# Patient Record
Sex: Female | Born: 1990 | Hispanic: No | Marital: Married | Smoking: Never smoker
Health system: Southern US, Community
[De-identification: ages and names within clinical notes are randomized; demographics above are authoritative.]

## PROBLEM LIST (undated history)

## (undated) ENCOUNTER — Inpatient Hospital Stay (HOSPITAL_COMMUNITY): Payer: Self-pay

## (undated) DIAGNOSIS — R519 Headache, unspecified: Secondary | ICD-10-CM

## (undated) DIAGNOSIS — Z8619 Personal history of other infectious and parasitic diseases: Secondary | ICD-10-CM

## (undated) DIAGNOSIS — D649 Anemia, unspecified: Secondary | ICD-10-CM

## (undated) DIAGNOSIS — Z8759 Personal history of other complications of pregnancy, childbirth and the puerperium: Secondary | ICD-10-CM

## (undated) DIAGNOSIS — R51 Headache: Secondary | ICD-10-CM

## (undated) HISTORY — DX: Personal history of other complications of pregnancy, childbirth and the puerperium: Z87.59

## (undated) HISTORY — PX: NO PAST SURGERIES: SHX2092

## (undated) HISTORY — DX: Personal history of other infectious and parasitic diseases: Z86.19

## (undated) HISTORY — DX: Anemia, unspecified: D64.9

---
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
## 2015-09-22 NOTE — L&D Delivery Note (Signed)
Delivery Note At 12:05 AM a viable female was delivered via Vaginal, Spontaneous Delivery (Presentation: LOA).  APGAR: 8, 9; weight  .   Placenta status: Spontaneous, intact..  Cord: occult prolapse, around feet.  Cord pH: NA  Anesthesia:  Epidural and local Episiotomy: None Lacerations: 2nd degree;Vaginal; Periclitoral--repaired with red robinson cath in place for urethral localization. Suture Repair: 3.0 vicryl Est. Blood Loss (mL): 400 cc--largely from lacerations.  Mom to postpartum.  Baby to Couplet care / Skin to Skin. Patient plans to take placenta home.  Unsure of contraception.  Nigel Bridgeman 04/19/2016, 12:49 AM

## 2015-10-11 LAB — OB RESULTS CONSOLE HIV ANTIBODY (ROUTINE TESTING): HIV: NONREACTIVE

## 2015-10-11 LAB — OB RESULTS CONSOLE GC/CHLAMYDIA
Chlamydia: NEGATIVE
GC PROBE AMP, GENITAL: NEGATIVE

## 2015-10-11 LAB — OB RESULTS CONSOLE HEPATITIS B SURFACE ANTIGEN: Hepatitis B Surface Ag: NEGATIVE

## 2015-10-11 LAB — OB RESULTS CONSOLE ABO/RH: RH Type: POSITIVE

## 2015-10-11 LAB — OB RESULTS CONSOLE RUBELLA ANTIBODY, IGM: Rubella: IMMUNE

## 2015-10-11 LAB — OB RESULTS CONSOLE ANTIBODY SCREEN: Antibody Screen: NEGATIVE

## 2015-10-11 LAB — OB RESULTS CONSOLE RPR: RPR: NONREACTIVE

## 2015-11-14 ENCOUNTER — Other Ambulatory Visit (HOSPITAL_COMMUNITY): Payer: Self-pay | Admitting: Certified Nurse Midwife

## 2015-11-14 DIAGNOSIS — O365920 Maternal care for other known or suspected poor fetal growth, second trimester, not applicable or unspecified: Secondary | ICD-10-CM

## 2015-11-14 DIAGNOSIS — Z3689 Encounter for other specified antenatal screening: Secondary | ICD-10-CM

## 2015-11-14 DIAGNOSIS — Z3A21 21 weeks gestation of pregnancy: Secondary | ICD-10-CM

## 2015-11-21 ENCOUNTER — Encounter (HOSPITAL_COMMUNITY): Payer: Self-pay

## 2015-11-21 ENCOUNTER — Ambulatory Visit (HOSPITAL_COMMUNITY)
Admission: RE | Admit: 2015-11-21 | Discharge: 2015-11-21 | Disposition: A | Discharge status: Home / Self Care | Payer: BLUE CROSS/BLUE SHIELD | Source: Ambulatory Visit | Attending: Certified Nurse Midwife | Admitting: Certified Nurse Midwife

## 2015-11-21 ENCOUNTER — Ambulatory Visit (HOSPITAL_COMMUNITY): Admission: RE | Admit: 2015-11-21 | Payer: BLUE CROSS/BLUE SHIELD | Source: Ambulatory Visit

## 2015-11-21 ENCOUNTER — Other Ambulatory Visit (HOSPITAL_COMMUNITY): Payer: Self-pay | Admitting: Certified Nurse Midwife

## 2015-11-21 DIAGNOSIS — Z3689 Encounter for other specified antenatal screening: Secondary | ICD-10-CM

## 2015-11-21 DIAGNOSIS — Z36 Encounter for antenatal screening of mother: Secondary | ICD-10-CM | POA: Diagnosis not present

## 2015-11-21 DIAGNOSIS — Z8489 Family history of other specified conditions: Secondary | ICD-10-CM

## 2015-11-21 DIAGNOSIS — Z3A21 21 weeks gestation of pregnancy: Secondary | ICD-10-CM

## 2015-11-21 DIAGNOSIS — O26842 Uterine size-date discrepancy, second trimester: Secondary | ICD-10-CM

## 2015-11-21 DIAGNOSIS — O365920 Maternal care for other known or suspected poor fetal growth, second trimester, not applicable or unspecified: Secondary | ICD-10-CM

## 2015-11-21 HISTORY — DX: Headache: R51

## 2015-11-21 HISTORY — DX: Headache, unspecified: R51.9

## 2015-11-29 ENCOUNTER — Other Ambulatory Visit (HOSPITAL_COMMUNITY): Payer: Self-pay

## 2016-04-09 LAB — OB RESULTS CONSOLE GBS: STREP GROUP B AG: NEGATIVE

## 2016-04-17 ENCOUNTER — Encounter (HOSPITAL_COMMUNITY): Payer: Self-pay | Admitting: *Deleted

## 2016-04-17 ENCOUNTER — Inpatient Hospital Stay (HOSPITAL_COMMUNITY)
Admission: AD | Admit: 2016-04-17 | Discharge: 2016-04-17 | Discharge status: Absent without leave | Payer: BLUE CROSS/BLUE SHIELD | Source: Home / Self Care | Attending: Obstetrics & Gynecology | Admitting: Obstetrics & Gynecology

## 2016-04-17 ENCOUNTER — Telehealth (HOSPITAL_COMMUNITY): Payer: Self-pay | Admitting: *Deleted

## 2016-04-17 ENCOUNTER — Other Ambulatory Visit: Payer: Self-pay | Admitting: Obstetrics & Gynecology

## 2016-04-17 NOTE — Telephone Encounter (Signed)
Preadmission screen  

## 2016-04-18 ENCOUNTER — Inpatient Hospital Stay (HOSPITAL_COMMUNITY): Payer: BLUE CROSS/BLUE SHIELD | Admitting: Anesthesiology

## 2016-04-18 ENCOUNTER — Inpatient Hospital Stay (HOSPITAL_COMMUNITY): Payer: BLUE CROSS/BLUE SHIELD

## 2016-04-18 ENCOUNTER — Encounter (HOSPITAL_COMMUNITY): Payer: Self-pay | Admitting: Emergency Medicine

## 2016-04-18 ENCOUNTER — Inpatient Hospital Stay (HOSPITAL_COMMUNITY)
Admission: RE | Admit: 2016-04-18 | Discharge: 2016-04-20 | DRG: 775 | Disposition: A | Discharge status: Home / Self Care | Payer: BLUE CROSS/BLUE SHIELD | Source: Other Acute Inpatient Hospital | Attending: Obstetrics & Gynecology | Admitting: Obstetrics & Gynecology

## 2016-04-18 ENCOUNTER — Inpatient Hospital Stay (HOSPITAL_COMMUNITY)
Admission: RE | Admit: 2016-04-18 | Discharge: 2016-04-18 | Disposition: A | Discharge status: Home / Self Care | Payer: BLUE CROSS/BLUE SHIELD | Source: Ambulatory Visit | Attending: Obstetrics & Gynecology | Admitting: Obstetrics & Gynecology

## 2016-04-18 DIAGNOSIS — Z6836 Body mass index (BMI) 36.0-36.9, adult: Secondary | ICD-10-CM

## 2016-04-18 DIAGNOSIS — Z8249 Family history of ischemic heart disease and other diseases of the circulatory system: Secondary | ICD-10-CM | POA: Diagnosis not present

## 2016-04-18 DIAGNOSIS — O99013 Anemia complicating pregnancy, third trimester: Secondary | ICD-10-CM | POA: Diagnosis present

## 2016-04-18 DIAGNOSIS — Z3A41 41 weeks gestation of pregnancy: Secondary | ICD-10-CM | POA: Diagnosis not present

## 2016-04-18 DIAGNOSIS — Z823 Family history of stroke: Secondary | ICD-10-CM

## 2016-04-18 DIAGNOSIS — O480 Post-term pregnancy: Secondary | ICD-10-CM | POA: Diagnosis present

## 2016-04-18 DIAGNOSIS — O9081 Anemia of the puerperium: Secondary | ICD-10-CM | POA: Diagnosis not present

## 2016-04-18 DIAGNOSIS — O99214 Obesity complicating childbirth: Secondary | ICD-10-CM | POA: Diagnosis present

## 2016-04-18 DIAGNOSIS — D62 Acute posthemorrhagic anemia: Secondary | ICD-10-CM | POA: Diagnosis not present

## 2016-04-18 LAB — CBC
HCT: 33.4 % — ABNORMAL LOW (ref 36.0–46.0)
Hemoglobin: 10.8 g/dL — ABNORMAL LOW (ref 12.0–15.0)
MCH: 23.3 pg — ABNORMAL LOW (ref 26.0–34.0)
MCHC: 32.3 g/dL (ref 30.0–36.0)
MCV: 72.1 fL — ABNORMAL LOW (ref 78.0–100.0)
PLATELETS: 170 10*3/uL (ref 150–400)
RBC: 4.63 MIL/uL (ref 3.87–5.11)
RDW: 17 % — AB (ref 11.5–15.5)
WBC: 10.7 10*3/uL — AB (ref 4.0–10.5)

## 2016-04-18 LAB — TYPE AND SCREEN
ABO/RH(D): O POS
ANTIBODY SCREEN: NEGATIVE

## 2016-04-18 LAB — ABO/RH: ABO/RH(D): O POS

## 2016-04-18 LAB — RPR: RPR Ser Ql: NONREACTIVE

## 2016-04-18 MED ORDER — FENTANYL CITRATE (PF) 100 MCG/2ML IJ SOLN
50.0000 ug | INTRAMUSCULAR | Status: DC | PRN
  Administered 2016-04-18 (×6): 100 ug via INTRAVENOUS
  Filled 2016-04-18 (×6): qty 2

## 2016-04-18 MED ORDER — OXYTOCIN BOLUS FROM INFUSION
500.0000 mL | Freq: Once | INTRAVENOUS | Status: AC
  Administered 2016-04-19: 500 mL via INTRAVENOUS

## 2016-04-18 MED ORDER — LACTATED RINGERS IV SOLN
INTRAVENOUS | Status: DC
  Administered 2016-04-18 (×3): via INTRAVENOUS

## 2016-04-18 MED ORDER — PHENYLEPHRINE 40 MCG/ML (10ML) SYRINGE FOR IV PUSH (FOR BLOOD PRESSURE SUPPORT)
80.0000 ug | PREFILLED_SYRINGE | INTRAVENOUS | Status: DC | PRN
  Filled 2016-04-18: qty 5
  Filled 2016-04-18: qty 10

## 2016-04-18 MED ORDER — MISOPROSTOL 25 MCG QUARTER TABLET
25.0000 ug | ORAL_TABLET | ORAL | Status: DC | PRN
  Administered 2016-04-18: 25 ug via VAGINAL
  Filled 2016-04-18: qty 0.25
  Filled 2016-04-18: qty 1

## 2016-04-18 MED ORDER — FLEET ENEMA 7-19 GM/118ML RE ENEM: 1.0000 | ENEMA | RECTAL | Status: DC | PRN

## 2016-04-18 MED ORDER — OXYTOCIN 40 UNITS IN LACTATED RINGERS INFUSION - SIMPLE MED
1.0000 m[IU]/min | INTRAVENOUS | Status: DC
  Administered 2016-04-18: 2 m[IU]/min via INTRAVENOUS
  Filled 2016-04-18: qty 1000

## 2016-04-18 MED ORDER — LACTATED RINGERS IV SOLN
500.0000 mL | Freq: Once | INTRAVENOUS | Status: AC
  Administered 2016-04-18: 500 mL via INTRAVENOUS

## 2016-04-18 MED ORDER — LACTATED RINGERS IV SOLN: 500.0000 mL | INTRAVENOUS | Status: DC | PRN

## 2016-04-18 MED ORDER — SODIUM BICARBONATE 8.4 % IV SOLN
INTRAVENOUS | Status: DC | PRN
  Administered 2016-04-18: 5 mL via EPIDURAL

## 2016-04-18 MED ORDER — ONDANSETRON HCL 4 MG/2ML IJ SOLN: 4.0000 mg | Freq: Four times a day (QID) | INTRAMUSCULAR | Status: DC | PRN

## 2016-04-18 MED ORDER — EPHEDRINE 5 MG/ML INJ
10.0000 mg | INTRAVENOUS | Status: DC | PRN
  Filled 2016-04-18: qty 4

## 2016-04-18 MED ORDER — ACETAMINOPHEN 325 MG PO TABS: 650.0000 mg | ORAL_TABLET | ORAL | Status: DC | PRN

## 2016-04-18 MED ORDER — LIDOCAINE HCL (PF) 1 % IJ SOLN
INTRAMUSCULAR | Status: AC
  Administered 2016-04-19: 30 mL
  Filled 2016-04-18: qty 30

## 2016-04-18 MED ORDER — SOD CITRATE-CITRIC ACID 500-334 MG/5ML PO SOLN: 30.0000 mL | ORAL | Status: DC | PRN

## 2016-04-18 MED ORDER — LIDOCAINE HCL (PF) 1 % IJ SOLN
30.0000 mL | INTRAMUSCULAR | Status: AC | PRN
  Administered 2016-04-18 (×3): 4 mL via SUBCUTANEOUS

## 2016-04-18 MED ORDER — PHENYLEPHRINE 40 MCG/ML (10ML) SYRINGE FOR IV PUSH (FOR BLOOD PRESSURE SUPPORT)
80.0000 ug | PREFILLED_SYRINGE | INTRAVENOUS | Status: DC | PRN
  Filled 2016-04-18: qty 10
  Filled 2016-04-18: qty 5
  Filled 2016-04-18: qty 10

## 2016-04-18 MED ORDER — FENTANYL 2.5 MCG/ML BUPIVACAINE 1/10 % EPIDURAL INFUSION (WH - ANES)
14.0000 mL/h | INTRAMUSCULAR | Status: DC | PRN
  Administered 2016-04-18 (×2): 14 mL/h via EPIDURAL
  Filled 2016-04-18 (×2): qty 125

## 2016-04-18 MED ORDER — TERBUTALINE SULFATE 1 MG/ML IJ SOLN
0.2500 mg | Freq: Once | INTRAMUSCULAR | Status: DC | PRN
  Filled 2016-04-18: qty 1

## 2016-04-18 MED ORDER — OXYTOCIN 40 UNITS IN LACTATED RINGERS INFUSION - SIMPLE MED
2.5000 [IU]/h | INTRAVENOUS | Status: DC
  Administered 2016-04-19: 2.5 [IU]/h via INTRAVENOUS

## 2016-04-18 MED ORDER — DIPHENHYDRAMINE HCL 50 MG/ML IJ SOLN: 12.5000 mg | INTRAMUSCULAR | Status: DC | PRN

## 2016-04-18 MED ORDER — OXYTOCIN 40 UNITS IN LACTATED RINGERS INFUSION - SIMPLE MED: 1.0000 m[IU]/min | INTRAVENOUS | Status: DC

## 2016-04-18 NOTE — Progress Notes (Signed)
  Subjective: S/p redose from Anesthesia, more comfortable now.  Objective: BP 114/61   Pulse 76   Temp 98.8 F (37.1 C) (Oral)   Resp 17   Ht 5\' 4"  (1.626 m)   Wt 97.1 kg (214 lb)   LMP 06/26/2015   SpO2 100%   BMI 36.73 kg/m  No intake/output data recorded. No intake/output data recorded.  FHT: Category 1 UC:   regular, every 3 minutes SVE:   Dilation: 7 Effacement (%): 80 Station: -1 Exam by:: Genelle Bal, RN  at 2032 Pitocin at 12 mu/min  Assessment:  Active labor GBS negative  Plan: Continue current care.  Nigel Bridgeman CNM 04/18/2016, 9:41 PM

## 2016-04-18 NOTE — H&P (Signed)
Kathleen Vega is a 25 y.o. female, G1P0 at 35 1/7 weeks, presenting for induction due to post-dates.  Seen at office 7/27, with cervix closed, 30%, vtx, -3.  Is unaware of any UCs.  Patient Active Problem List   Diagnosis Date Noted  . Post-dates pregnancy 04/18/2016  . Anemia affecting pregnancy in third trimester 04/18/2016    History of present pregnancy: Patient entered care at 10 weeks.   EDC of 04/10/16 was established by [place] at 19-20 weeks.   Anatomy scan:  20 weeks, with normal findings and a posterior placenta, but growth lag vs inaccurate dating.  Additional [place] evaluations:   26 3/7 weeks--EFW 32%ile, normal fluid, frank breech, cervix closed.   03/04/16--AFI WNL, vtx. Significant prenatal events:  ? Dating by LMP, with growth lag noted on anatomy US.  F/u US in MFM established due date as 04/10/16, with normal growth. Last evaluation:  04/16/16--Cervix closed, 30%, vtx, -3, weight 214, 1+ edema, BP 144/68, 130/80.  Scheduled for induction 04/18/16.  OB History    Gravida Para Term Preterm AB Living   1             SAB TAB Ectopic Multiple Live Births                 Past Medical History:  Diagnosis Date  . Anemia   . Headache   . Hx of varicella    Past Surgical History:  Procedure Laterality Date  . NO PAST SURGERIES     Family History: Father HTN and CVA; Mother lupus Social History:  reports that she has never smoked. She has never used smokeless tobacco. She reports that she does not drink alcohol or use drugs.  Patient is Tree surgeon, married to Teofilo Pod, who is involved and supportive.  She is employed.   Prenatal Transfer Tool  Maternal Diabetes: No Genetic Screening: Declined Maternal Ultrasounds/Referrals: Normal Fetal Ultrasounds or other Referrals:  None Maternal Substance Abuse:  No Significant Maternal Medications:  None Significant Maternal Lab Results: Lab values include: Group B Strep negative  TDAP 01/07/16 Flu 01/07/16  ROS:  Good  fetal movement, denies UCs  No Known Allergies   Dilation: Closed Effacement (%): Thick Station: -3 Exam by:: V.Saprina Chuong CNM Blood pressure 124/71, pulse 72, temperature 98.5 F (36.9 C), temperature source Oral, resp. rate 18, height  (1.626 m), weight 97.1 kg (214 lb), last menstrual period 06/26/2015.  Chest clear Heart RRR without murmur Abd gravid, NT, FH 40 cm, EFW 8-9 lbs Pelvic: Posterior, closed, soft, thick, vtx, -3 Ext: WNL Cytotech 25 mcg placed in posterior fornix at 0117.  FHR: Category 1 UCs:  Occasional, mild, patient unaware  Prenatal labs: ABO, Rh: O/Positive/-- (01/20 0000) Antibody: Negative (01/20 0000) Rubella:  Immune RPR: Nonreactive (01/20 0000)  HBsAg: Negative (01/20 0000)  HIV: Non-reactive (01/20 0000)  GBS: Negative (07/20 0000) Sickle cell/Hgb electrophoresis:  AA Pap:  WNL 09/2015 GC:  Negative 03/19/16 Chlamydia:  Negative 03/19/16 Genetic screenings:  Declined Glucola:  WNL Other:   Hgb 11.4 at NOB, 9.3 at 28 weeks   Assessment/Plan: IUP at 41 1/7 weeks--for induction GBS negative Unfavorable cervix  Plan: Admit to Birthing Suite per consult with Dr Sallye Ober Routine [facility] orders Pain med/epidural prn Cytotech for cervical ripening, then pitocin/foley bulb/AROM as appropriate Risks and benefits of induction were reviewed, including failure of method, prolonged labor, need for further intervention, risk of cesarean.  Patient and family seem to understand these risks and wish to  proceed.  Emali Heyward CNM, [place] 04/18/2016, 1:22 AM

## 2016-04-18 NOTE — Anesthesia Procedure Notes (Signed)
Epidural Patient location during procedure: OB Start time: 04/18/2016 5:33 PM  Staffing Anesthesiologist: Finesse Fielder  Preanesthetic Checklist Completed: patient identified, site marked, surgical consent, pre-op evaluation, timeout performed, IV checked, risks and benefits discussed and monitors and equipment checked  Epidural Patient position: sitting Prep: site prepped and draped and DuraPrep Patient monitoring: continuous pulse ox and blood pressure Approach: midline Location: L3-L4 Injection technique: LOR air  Needle:  Needle type: Tuohy  Needle gauge: 17 G Needle length: 9 cm and 9 Needle insertion depth: 6 cm Catheter type: closed end flexible Catheter size: 19 Gauge Catheter at skin depth: 10 cm Test dose: negative  Assessment Events: blood not aspirated, injection not painful, no injection resistance, negative IV test and no paresthesia

## 2016-04-18 NOTE — Progress Notes (Addendum)
Patient ID: Kathleen Vega, female   DOB: 02/01/1991, 25 y.o.   MRN: [id number] Pt feeling some contractions BP (!) 103/48   Pulse 65   Temp 97.7 F (36.5 C) (Axillary)   Resp 18   Ht 5\' 4"  (1.626 m)   Wt 214 lb (97.1 kg)   LMP 06/26/2015   BMI 36.73 kg/m  Cat 1 Continue pitocin 3/100/-3 AROM clear Anticipate SVD

## 2016-04-18 NOTE — Progress Notes (Signed)
  Subjective: Feeling more pressure, pain again on right side.  Objective: BP (!) 116/57   Pulse 72   Temp 98.8 F (37.1 C) (Oral)   Resp 18   Ht 5\' 4"  (1.626 m)   Wt 97.1 kg (214 lb)   LMP 06/26/2015   SpO2 100%   BMI 36.73 kg/m  No intake/output data recorded. No intake/output data recorded.  FHT: Category 1 UC:   regular, every 3 minutes SVE:   Dilation: 10 Effacement (%): 80 Station: -1 Exam by:: Renaldo Reel. CNM  Pitocin at 14 mu/min  Assessment:  2nd stage labor  Plan: Observe at present, laboring down, start pushing with increased pressure.  Nigel Bridgeman CNM 04/18/2016, 10:39 PM

## 2016-04-18 NOTE — Anesthesia Preprocedure Evaluation (Signed)
Anesthesia Evaluation  Patient identified by MRN, date of birth, ID band Patient awake    Reviewed: Allergy & Precautions, NPO status , Patient's Chart, lab work & pertinent test results  Airway Mallampati: I  TM Distance: >3 FB Neck ROM: Full    Dental  (+) Teeth Intact, Dental Advisory Given   Pulmonary    breath sounds clear to auscultation       Cardiovascular  Rhythm:Regular Rate:Normal     Neuro/Psych    GI/Hepatic   Endo/Other  Morbid obesity  Renal/GU      Musculoskeletal   Abdominal   Peds  Hematology   Anesthesia Other Findings   Reproductive/Obstetrics (+) Pregnancy                             Anesthesia Physical Anesthesia Plan  ASA: II  Anesthesia Plan: Epidural   Post-op Pain Management:    Induction:   Airway Management Planned:   Additional Equipment:   Intra-op Plan:   Post-operative Plan:   Informed Consent: I have reviewed the patients History and Physical, chart, labs and discussed the procedure including the risks, benefits and alternatives for the proposed anesthesia with the patient or authorized representative who has indicated his/her understanding and acceptance.   Dental advisory given  Plan Discussed with: Anesthesiologist  Anesthesia Plan Comments:         Anesthesia Quick Evaluation

## 2016-04-18 NOTE — Progress Notes (Addendum)
  Subjective: Has cramped significantly since Cytotech--received Fentanyl at 0423 with benefit.  Objective: BP 116/69 (BP Location: Left Arm)   Pulse 68   Temp 98.3 F (36.8 C) (Oral)   Resp 18   Ht 5\' 4"  (1.626 m)   Wt 97.1 kg (214 lb)   LMP 06/26/2015   BMI 36.73 kg/m  No intake/output data recorded. No intake/output data recorded.  FHT: Category 1 UC:   irregular, every 2-5 minutes SVE:   Dilation: 1 Effacement (%): 90 Station: -2 Exam by:: V.Breyona Swander CNM  Very adequate pelvis BBOW  Assessment:  Induction for post-dates Good response to single dose cytotech at 0117 GBS negative  Plan: Start pitocin per low dose protocol. Pain med prn.  Nigel Bridgeman CNM 04/18/2016, 6:26 AM

## 2016-04-18 NOTE — Progress Notes (Signed)
Patient reports inability to rest and remain in one position due to continuous cramping causing constant movement of fetal monitors prompting RN repositioning of monitors. CNM aware of spacing in FHR tracing is due to maternal movement.

## 2016-04-18 NOTE — Anesthesia Pain Management Evaluation Note (Signed)
  CRNA Pain Management Visit Note  Patient: Kathleen Vega, 25 y.o., female  "Hello I am a member of the anesthesia team at [facility]. We have an anesthesia team available at all times to provide care throughout the hospital, including epidural management and anesthesia for C-section. I don't know your plan for the delivery whether it a natural birth, water birth, IV sedation, nitrous supplementation, doula or epidural, but we want to meet your pain goals."   1.Was your pain managed to your expectations on prior hospitalizations?    2.What is your expectation for pain management during this hospitalization?      3.How can we help you reach that goal?   Record the patient's initial score and the patient's pain goal.   Pain: 5  Pain Goal: 9 [facility] wants you to be able to say your pain was always managed very well.  Laban Emperor 04/18/2016

## 2016-04-19 ENCOUNTER — Encounter (HOSPITAL_COMMUNITY): Payer: Self-pay

## 2016-04-19 LAB — CBC
HEMATOCRIT: 27.5 % — AB (ref 36.0–46.0)
Hemoglobin: 9 g/dL — ABNORMAL LOW (ref 12.0–15.0)
MCH: 23.5 pg — ABNORMAL LOW (ref 26.0–34.0)
MCHC: 32.7 g/dL (ref 30.0–36.0)
MCV: 71.8 fL — ABNORMAL LOW (ref 78.0–100.0)
Platelets: 161 10*3/uL (ref 150–400)
RBC: 3.83 MIL/uL — ABNORMAL LOW (ref 3.87–5.11)
RDW: 17 % — AB (ref 11.5–15.5)
WBC: 20.6 10*3/uL — ABNORMAL HIGH (ref 4.0–10.5)

## 2016-04-19 MED ORDER — BENZOCAINE-MENTHOL 20-0.5 % EX AERO
1.0000 "application " | INHALATION_SPRAY | CUTANEOUS | Status: DC | PRN
  Administered 2016-04-19: 1 via TOPICAL
  Filled 2016-04-19: qty 56

## 2016-04-19 MED ORDER — SENNOSIDES-DOCUSATE SODIUM 8.6-50 MG PO TABS
2.0000 | ORAL_TABLET | ORAL | Status: DC
  Administered 2016-04-19: 2 via ORAL
  Filled 2016-04-19: qty 2

## 2016-04-19 MED ORDER — ZOLPIDEM TARTRATE 5 MG PO TABS: 5.0000 mg | ORAL_TABLET | Freq: Every evening | ORAL | Status: DC | PRN

## 2016-04-19 MED ORDER — DIPHENHYDRAMINE HCL 25 MG PO CAPS: 25.0000 mg | ORAL_CAPSULE | Freq: Four times a day (QID) | ORAL | Status: DC | PRN

## 2016-04-19 MED ORDER — SIMETHICONE 80 MG PO CHEW: 80.0000 mg | CHEWABLE_TABLET | ORAL | Status: DC | PRN

## 2016-04-19 MED ORDER — COCONUT OIL OIL: 1.0000 "application " | TOPICAL_OIL | Status: DC | PRN

## 2016-04-19 MED ORDER — DIBUCAINE 1 % RE OINT: 1.0000 "application " | TOPICAL_OINTMENT | RECTAL | Status: DC | PRN

## 2016-04-19 MED ORDER — WITCH HAZEL-GLYCERIN EX PADS: 1.0000 "application " | MEDICATED_PAD | CUTANEOUS | Status: DC | PRN

## 2016-04-19 MED ORDER — OXYCODONE HCL 5 MG PO TABS: 10.0000 mg | ORAL_TABLET | ORAL | Status: DC | PRN

## 2016-04-19 MED ORDER — OXYCODONE HCL 5 MG PO TABS: 5.0000 mg | ORAL_TABLET | ORAL | Status: DC | PRN

## 2016-04-19 MED ORDER — ONDANSETRON HCL 4 MG PO TABS: 4.0000 mg | ORAL_TABLET | ORAL | Status: DC | PRN

## 2016-04-19 MED ORDER — ACETAMINOPHEN 325 MG PO TABS: 650.0000 mg | ORAL_TABLET | ORAL | Status: DC | PRN

## 2016-04-19 MED ORDER — ONDANSETRON HCL 4 MG/2ML IJ SOLN: 4.0000 mg | INTRAMUSCULAR | Status: DC | PRN

## 2016-04-19 MED ORDER — TETANUS-DIPHTH-ACELL PERTUSSIS 5-2.5-18.5 LF-MCG/0.5 IM SUSP: 0.5000 mL | Freq: Once | INTRAMUSCULAR | Status: DC

## 2016-04-19 MED ORDER — FERROUS SULFATE 300 (60 FE) MG/5ML PO SYRP
300.0000 mg | ORAL_SOLUTION | Freq: Every day | ORAL | Status: DC
  Administered 2016-04-19 – 2016-04-20 (×2): 300 mg via ORAL
  Filled 2016-04-19 (×2): qty 5

## 2016-04-19 MED ORDER — IBUPROFEN 600 MG PO TABS
600.0000 mg | ORAL_TABLET | Freq: Four times a day (QID) | ORAL | Status: DC
  Administered 2016-04-19 – 2016-04-20 (×5): 600 mg via ORAL
  Filled 2016-04-19 (×5): qty 1

## 2016-04-19 MED ORDER — PRENATAL MULTIVITAMIN CH
1.0000 | ORAL_TABLET | Freq: Every day | ORAL | Status: DC
  Administered 2016-04-19: 1 via ORAL
  Filled 2016-04-19: qty 1

## 2016-04-19 NOTE — Addendum Note (Signed)
Addendum  created 04/19/16 0755 by Angela Adam, CRNA   Charge Capture section accepted, Sign clinical note, Visit diagnoses modified

## 2016-04-19 NOTE — Progress Notes (Addendum)
Subjective: Postpartum Day 0: Vaginal delivery, peri-clitoral and 2nd degree vaginal laceration Patient up ad lib, reports no syncope or dizziness. Feeding:  Breast feeding, plans pumping Contraceptive plan:  Undecided at present  Objective: Vital signs in last 24 hours: Temp:  [97.5 F (36.4 C)-99 F (37.2 C)] 98.7 F (37.1 C) (07/30 1720) Pulse Rate:  [64-128] 74 (07/30 1720) Resp:  [17-20] 18 (07/30 1720) BP: (102-132)/(49-73) 117/59 (07/30 1720) SpO2:  [97 %-100 %] 100 % (07/30 1720)  Physical Exam:  General: alert Lochia: appropriate Uterine Fundus: firm Perineum: healing well DVT Evaluation: No evidence of DVT seen on physical exam. Negative Homan's sign.   CBC Latest Ref Rng & Units 04/19/2016 04/18/2016  WBC 4.0 - 10.5 K/uL 20.6(H) 10.7(H)  Hemoglobin 12.0 - 15.0 g/dL 9.0(L) 10.8(L)  Hematocrit 36.0 - 46.0 % 27.5(L) 33.4(L)  Platelets 150 - 400 K/uL 161 170     Assessment/Plan: Status post vaginal delivery day 0. Exacerbation of chronic anemia due to acute blood loss, hemodynamically stable Leukocytosis without fever Stable Continue current care. Repeat CBC/diff tomorrow. Continue Fe suspension daily      Marleta Lapierre, VICKICNM 04/19/2016, 8:31 PM

## 2016-04-19 NOTE — Anesthesia Postprocedure Evaluation (Signed)
Anesthesia Post Note  Patient: Kathleen Vega  Procedure(s) Performed: * No procedures listed *  Patient location during evaluation: Mother Baby Anesthesia Type: Epidural Level of consciousness: awake and alert, oriented and patient cooperative Pain management: pain level controlled Vital Signs Assessment: post-procedure vital signs reviewed and stable Respiratory status: spontaneous breathing Cardiovascular status: stable Postop Assessment: no headache, epidural receding, patient able to bend at knees and no signs of nausea or vomiting Anesthetic complications: no Comments: Denies pain.     Last Vitals:  Vitals:   04/19/16 0315 04/19/16 0554  BP: (!) 114/58 (!) 112/59  Pulse: 76 85  Resp: 18 18  Temp: 36.4 C 36.8 C    Last Pain:  Vitals:   04/19/16 0554  TempSrc:   PainSc: (P) 0-No pain   Pain Goal: Patients Stated Pain Goal: 4 (04/18/16 1741)               Merrilyn Puma

## 2016-04-19 NOTE — Anesthesia Postprocedure Evaluation (Signed)
Anesthesia Post Note  Patient: Kathleen Vega  Procedure(s) Performed: * No procedures listed *  Patient location during evaluation: Mother Baby Anesthesia Type: Epidural Level of consciousness: awake and alert Pain management: pain level controlled Vital Signs Assessment: post-procedure vital signs reviewed and stable Respiratory status: spontaneous breathing, nonlabored ventilation and respiratory function stable Cardiovascular status: stable Postop Assessment: no headache, no backache and epidural receding Anesthetic complications: no     Last Vitals:  Vitals:   04/19/16 0315 04/19/16 0554  BP: (!) 114/58 (!) 112/59  Pulse: 76 85  Resp: 18 18  Temp: 36.4 C 36.8 C    Last Pain:  Vitals:   04/19/16 0554  TempSrc:   PainSc: (P) 0-No pain   Pain Goal: Patients Stated Pain Goal: 4 (04/18/16 1741)               Chana Lindstrom A

## 2016-04-19 NOTE — Lactation Note (Signed)
This note was copied from a baby's chart. Lactation Consultation Note  Patient Name: Kathleen Vega [id number] Date: 04/19/2016 Reason for consult: Initial assessment Baby at 17 hr of life. Mom is latching baby while in the hospital but her long term goal is pump to feed. She requested help setting up her personal Medela DEBP. There are 2 grandmothers present that are not supportive of bf. They are telling mom the baby is starving, there is no milk in her breast yet, and the baby needs some formula. Baby has a noticeable lingual frenulum with an anterior insertion point. Baby can cup tongue, has some lateralization, has good peristolic motion in between periods of biting down, can lift tongue to midline, and can extend tongue slightly over gum ridge. Mom reports no pain with latch but had a compression stripe. Mom is letting baby suck the tip of the nipple in and is not supporting baby while she is latching. Mom prefers the football position. Showed mom how to wait for a wide open mouth then guide baby deeply onto the breast. Discussed baby behavior, feeding frequency, pumping, baby belly size, voids, wt loss, breast changes, and nipple care. Mom can manually express and has spoon in room. Given lactation handouts. Aware of OP services and support group.      Maternal Data Has patient been taught Hand Expression?: Yes Does the patient have breastfeeding experience prior to this delivery?: No  Feeding Feeding Type: Breast Fed  LATCH Score/Interventions Latch: Repeated attempts needed to sustain latch, nipple held in mouth throughout feeding, stimulation needed to elicit sucking reflex. Intervention(s): Adjust position;Assist with latch;Breast compression  Audible Swallowing: A few with stimulation Intervention(s): Hand expression;Skin to skin;Alternate breast massage  Type of Nipple: Everted at rest and after stimulation  Comfort (Breast/Nipple): Soft / non-tender     Hold (Positioning):  Full assist, staff holds infant at breast Intervention(s): Support Pillows;Position options;Skin to skin  LATCH Score: 6  Lactation Tools Discussed/Used WIC Program: No Pump Review: Setup, frequency, and cleaning;Milk Storage Initiated by:: ES Date initiated:: 04/19/16   Consult Status Consult Status: Follow-up Date: 04/20/16 Follow-up type: In-patient    Rulon Eisenmenger 04/19/2016, 5:30 PM

## 2016-04-20 LAB — CBC WITH DIFFERENTIAL/PLATELET
BASOS ABS: 0 10*3/uL (ref 0.0–0.1)
Basophils Relative: 0 %
EOS ABS: 0.2 10*3/uL (ref 0.0–0.7)
EOS PCT: 1 %
HCT: 28.3 % — ABNORMAL LOW (ref 36.0–46.0)
HEMOGLOBIN: 9 g/dL — AB (ref 12.0–15.0)
Lymphocytes Relative: 22 %
Lymphs Abs: 3 10*3/uL (ref 0.7–4.0)
MCH: 23.1 pg — ABNORMAL LOW (ref 26.0–34.0)
MCHC: 31.8 g/dL (ref 30.0–36.0)
MCV: 72.6 fL — ABNORMAL LOW (ref 78.0–100.0)
Monocytes Absolute: 0.8 10*3/uL (ref 0.1–1.0)
Monocytes Relative: 6 %
NEUTROS PCT: 71 %
Neutro Abs: 9.7 10*3/uL — ABNORMAL HIGH (ref 1.7–7.7)
PLATELETS: 162 10*3/uL (ref 150–400)
RBC: 3.9 MIL/uL (ref 3.87–5.11)
RDW: 17.3 % — ABNORMAL HIGH (ref 11.5–15.5)
WBC: 13.8 10*3/uL — AB (ref 4.0–10.5)

## 2016-04-20 MED ORDER — IBUPROFEN 600 MG PO TABS: 600.0000 mg | ORAL_TABLET | Freq: Four times a day (QID) | ORAL | 0 refills | Status: DC | PRN

## 2016-04-20 MED ORDER — OXYCODONE-ACETAMINOPHEN 5-325 MG PO TABS: 1.0000 | ORAL_TABLET | Freq: Four times a day (QID) | ORAL | 0 refills | Status: DC | PRN

## 2016-04-20 NOTE — Lactation Note (Signed)
This note was copied from a baby's chart. Lactation Consultation Note RN called for assistance latching. Mom states she can latch baby to Rt. Breast but not the Lt. Mom has pendulum breast that look the same. Everted nipple at the bottom end of the breast. Compressible w/good flow of colostrum. Elevated breast w/wash cloth for support. In football position latched baby to breast. LC feels its a positional w/mom and coordination issue. Baby latched well, heard occasional swallow. Mom is quiet and timid. Encouraged to do teach back to make sure mom understands. Mom appears to be unsure of her self or have slow cognition? Or tired?? Mom had told Rn she wanted to go home today. THIS LC DOES NOT RECOMMEND MOM TO BE D/C HOME TODAY. LC feels mom needs to be more independent and more sure w/BF.  Patient Name: Kathleen Vega [id number] Date: 04/20/2016 Reason for consult: Follow-up assessment;Difficult latch   Maternal Data    Feeding Feeding Type: Breast Fed Length of feed: 15 min (still BF)  LATCH Score/Interventions Latch: Repeated attempts needed to sustain latch, nipple held in mouth throughout feeding, stimulation needed to elicit sucking reflex. Intervention(s): Adjust position;Assist with latch;Breast massage;Breast compression  Audible Swallowing: A few with stimulation Intervention(s): Skin to skin;Hand expression;Alternate breast massage  Type of Nipple: Everted at rest and after stimulation  Comfort (Breast/Nipple): Soft / non-tender     Hold (Positioning): Assistance needed to correctly position infant at breast and maintain latch. Intervention(s): Breastfeeding basics reviewed;Support Pillows;Position options;Skin to skin  LATCH Score: 7  Lactation Tools Discussed/Used     Consult Status Consult Status: Follow-up Date: 04/20/16 (in pm) Follow-up type: In-patient    Rishon Thilges, Diamond Nickel 04/20/2016, 3:21 AM

## 2016-04-20 NOTE — Discharge Summary (Signed)
Obstetric Discharge Summary Reason for Admission: induction of labor Prenatal Procedures: ultrasound Intrapartum Procedures: spontaneous vaginal delivery Postpartum Procedures: none Complications-Operative and Postpartum: none Hemoglobin  Date Value Ref Range Status  04/20/2016 9.0 (L) 12.0 - 15.0 g/dL Final   HCT  Date Value Ref Range Status  04/20/2016 28.3 (L) 36.0 - 46.0 % Final    Physical Exam:  General: alert and no distress Lochia: appropriate Uterine Fundus: firm Incision: n/a DVT Evaluation: No evidence of DVT seen on physical exam.  Discharge Diagnoses: Term Pregnancy-delivered  Discharge Information: Date: 04/20/2016 Activity: pelvic rest Diet: routine Medications: PNV, Ibuprofen and Percocet Condition: stable Instructions: refer to practice specific booklet Discharge to: home Follow-up Information    [facility] Follow up in 6 week(s).   Specialty:  Obstetrics and Gynecology Why:  Please call to schedule post partum appointment Contact information: [address] [place] [address] [phone]          Newborn Data: Live born female  Birth Weight: 8 lb 2.9 oz (3711 g) APGAR: 8, 9  Home with mother.  Mardella Nuckles Y 04/20/2016, 11:02 AM

## 2016-04-20 NOTE — Lactation Note (Signed)
This note was copied from a baby's chart. Lactation Consultation Note; Mom ready for DC- baby in car seat. Mom states she is going to pump and bottle feed EBM to baby. Does not want to put baby to the breast. Has her own Medela pump for home. Reports LC assisted her with setup of pump but she has not tried it yet. Dad already has it in the car. Encouraged mom to pump at least 8 times/day to promote milk supply.Had to repeat instructions several times for mom to understand. Reviewed OP appointments as resource if she wants to put baby to breast. No questions at present. To call prn  Patient Name: Kathleen Vega [id number] Date: 04/20/2016 Reason for consult: Follow-up assessment   Maternal Data Formula Feeding for Exclusion: No Has patient been taught Hand Expression?: Yes Does the patient have breastfeeding experience prior to this delivery?: No  Feeding Feeding Type: Breast Fed Length of feed: 15 min  LATCH Score/Interventions Latch: Grasps breast easily, tongue down, lips flanged, rhythmical sucking. Intervention(s): Adjust position  Audible Swallowing: A few with stimulation Intervention(s): Skin to skin  Type of Nipple: Everted at rest and after stimulation  Comfort (Breast/Nipple): Soft / non-tender     Hold (Positioning): Assistance needed to correctly position infant at breast and maintain latch.  LATCH Score: 8  Lactation Tools Discussed/Used WIC Program: No   Consult Status Consult Status: Complete    Pamelia Hoit 04/20/2016, 10:11 AM

## 2016-04-24 ENCOUNTER — Inpatient Hospital Stay (HOSPITAL_COMMUNITY): Admission: RE | Admit: 2016-04-24 | Payer: BLUE CROSS/BLUE SHIELD | Source: Ambulatory Visit

## 2017-08-01 ENCOUNTER — Inpatient Hospital Stay (HOSPITAL_COMMUNITY): Payer: 59

## 2017-08-01 ENCOUNTER — Inpatient Hospital Stay (HOSPITAL_COMMUNITY)
Admission: AD | Admit: 2017-08-01 | Discharge: 2017-08-01 | Disposition: A | Discharge status: Home / Self Care | Payer: 59 | Source: Ambulatory Visit | Attending: Obstetrics and Gynecology | Admitting: Obstetrics and Gynecology

## 2017-08-01 ENCOUNTER — Other Ambulatory Visit: Payer: Self-pay

## 2017-08-01 ENCOUNTER — Encounter (HOSPITAL_COMMUNITY): Payer: Self-pay | Admitting: *Deleted

## 2017-08-01 DIAGNOSIS — R109 Unspecified abdominal pain: Secondary | ICD-10-CM | POA: Diagnosis present

## 2017-08-01 DIAGNOSIS — Z3A01 Less than 8 weeks gestation of pregnancy: Secondary | ICD-10-CM | POA: Insufficient documentation

## 2017-08-01 DIAGNOSIS — O26891 Other specified pregnancy related conditions, first trimester: Secondary | ICD-10-CM | POA: Diagnosis not present

## 2017-08-01 LAB — URINALYSIS, ROUTINE W REFLEX MICROSCOPIC
Bilirubin Urine: NEGATIVE
GLUCOSE, UA: NEGATIVE mg/dL
Hgb urine dipstick: NEGATIVE
Ketones, ur: NEGATIVE mg/dL
Nitrite: NEGATIVE
PH: 7 (ref 5.0–8.0)
Protein, ur: NEGATIVE mg/dL
SPECIFIC GRAVITY, URINE: 1.012 (ref 1.005–1.030)

## 2017-08-01 LAB — WET PREP, GENITAL
SPERM: NONE SEEN
TRICH WET PREP: NONE SEEN
YEAST WET PREP: NONE SEEN

## 2017-08-01 LAB — POCT PREGNANCY, URINE: Preg Test, Ur: POSITIVE — AB

## 2017-08-01 LAB — HCG, QUANTITATIVE, PREGNANCY: HCG, BETA CHAIN, QUANT, S: 29390 m[IU]/mL — AB (ref <5)

## 2017-08-01 MED ORDER — PROMETHAZINE HCL 12.5 MG PO TABS: 12.5000 mg | ORAL_TABLET | Freq: Four times a day (QID) | ORAL | 0 refills | Status: DC | PRN

## 2017-08-01 MED ORDER — METRONIDAZOLE 500 MG PO TABS: 500.0000 mg | ORAL_TABLET | Freq: Two times a day (BID) | ORAL | 0 refills | Status: DC

## 2017-08-01 NOTE — MAU Note (Signed)
Having bad cramping in mid to lower abd since 0030. Denies LOF or bleeding

## 2017-08-01 NOTE — MAU Provider Note (Signed)
History    Towanda Malkiniffany Olthoff is a 26y.o. G2P1001 at 6.2wks who presents unannounced, for abdominal cramping.  Reports pain started at 0030 and has been constant in nature.  Patient denies current back pain, but states pain was radiating to her back.  Patient denies VB and discharge, constipation, diarrhea, or problems with urination.  Most recent sexual encounter was on Thursday.  LMP: 06/18/2017  Patient Active Problem List   Diagnosis Date Noted  . Vaginal delivery 04/19/2016  . Post-dates pregnancy 04/18/2016  . Anemia affecting pregnancy in third trimester 04/18/2016    Chief Complaint  Patient presents with  . Abdominal Pain   HPI  OB History    Gravida Para Term Preterm AB Living   2 1 1     1    SAB TAB Ectopic Multiple Live Births         0 1      Past Medical History:  Diagnosis Date  . Anemia   . Headache   . Hx of varicella     Past Surgical History:  Procedure Laterality Date  . NO PAST SURGERIES      Family History  Problem Relation Age of Onset  . Alcohol abuse Neg Hx   . Arthritis Neg Hx   . Asthma Neg Hx   . Birth defects Neg Hx   . Cancer Neg Hx   . COPD Neg Hx   . Depression Neg Hx   . Diabetes Neg Hx   . Drug abuse Neg Hx   . Early death Neg Hx   . Hearing loss Neg Hx   . Heart disease Neg Hx   . Hyperlipidemia Neg Hx   . Hypertension Neg Hx   . Kidney disease Neg Hx   . Learning disabilities Neg Hx   . Mental illness Neg Hx   . Mental retardation Neg Hx   . Miscarriages / Stillbirths Neg Hx   . Stroke Neg Hx   . Vision loss Neg Hx   . Varicose Veins Neg Hx     Social History   Tobacco Use  . Smoking status: Never Smoker  . Smokeless tobacco: Never Used  Substance Use Topics  . Alcohol use: No  . Drug use: No    Allergies: No Known Allergies  Medications Prior to Admission  Medication Sig Dispense Refill Last Dose  . Prenatal Vit-Fe Fumarate-FA (PRENATAL MULTIVITAMIN) TABS tablet Take 1 tablet by mouth daily.   07/31/2017 at  Unknown time  . ferrous sulfate 300 (60 Fe) MG/5ML syrup Take 300 mg by mouth daily.   04/17/2016 at Unknown time  . ibuprofen (ADVIL,MOTRIN) 600 MG tablet Take 1 tablet (600 mg total) by mouth every 6 (six) hours as needed. 30 tablet 0   . oxyCODONE-acetaminophen (PERCOCET) 5-325 MG tablet Take 1-2 tablets by mouth every 6 (six) hours as needed for moderate pain or severe pain. 30 tablet 0     ROS  See HPI Above Physical Exam   Blood pressure 120/64, pulse 69, temperature (!) 97.2 F (36.2 C), resp. rate 18, height 5\' 4"  (1.626 m), weight 91.6 kg (202 lb), last menstrual period 06/18/2017, unknown if currently breastfeeding.  Results for orders placed or performed during the hospital encounter of 08/01/17 (from the past 24 hour(s))  Urinalysis, Routine w reflex microscopic     Status: Abnormal   Collection Time: 08/01/17  1:20 AM  Result Value Ref Range   Color, Urine YELLOW YELLOW   APPearance CLOUDY (A) CLEAR  Specific Gravity, Urine 1.012 1.005 - 1.030   pH 7.0 5.0 - 8.0   Glucose, UA NEGATIVE NEGATIVE mg/dL   Hgb urine dipstick NEGATIVE NEGATIVE   Bilirubin Urine NEGATIVE NEGATIVE   Ketones, ur NEGATIVE NEGATIVE mg/dL   Protein, ur NEGATIVE NEGATIVE mg/dL   Nitrite NEGATIVE NEGATIVE   Leukocytes, UA TRACE (A) NEGATIVE   RBC / HPF 0-5 0 - 5 RBC/hpf   WBC, UA 0-5 0 - 5 WBC/hpf   Bacteria, UA RARE (A) NONE SEEN   Squamous Epithelial / LPF 0-5 (A) NONE SEEN   Mucus PRESENT   Pregnancy, urine POC     Status: Abnormal   Collection Time: 08/01/17  1:30 AM  Result Value Ref Range   Preg Test, Ur POSITIVE (A) NEGATIVE    Physical Exam  Constitutional: She is oriented to person, place, and time. She appears well-developed and well-nourished. No distress.  HENT:  Head: Normocephalic and atraumatic.  Eyes: Conjunctivae are normal.  Neck: Normal range of motion.  Cardiovascular: Normal rate and regular rhythm.  Respiratory: Effort normal and breath sounds normal.  GI: Soft.  Bowel sounds are normal.  Genitourinary: Uterus is enlarged. Cervix exhibits discharge. Cervix exhibits no motion tenderness and no friability. No bleeding in the vagina. Vaginal discharge found.  Genitourinary Comments:  Speculum Exam: -Vaginal Vault: Small amt thin white malodorous discharge -wet prep collected -Cervix:Thick mucoid discharge from os-GC/CT collected -Bimanual Exam: Closed/Long/Thick/Ballotable  Musculoskeletal: Normal range of motion.  Neurological: She is alert and oriented to person, place, and time.  Skin: Skin is warm and dry.  Psychiatric: She has a normal mood and affect. Her behavior is normal.     ED Course  Assessment: IUP at 6.2wks by LMP Abdominal Pain  Plan: -PE as above -Labs: UA, Wet prep, Gc/CT, Quant -US to confirm pregnancy  Follow Up (0340) -US report returns with FHR at 97bpm -Discussed need for f/u ultrasound for low FHR on 11/19 during NOB interview -Clue cells noted; Rx for flagyl 500mg  BID Disp 14, RF 0 -Discussed ways to prevent bacterial vaginosis:  +Wear cotton underwear +Use low scent or scent free soaps and detergents +No douching -Rx for phenergan 12.5mg  Disp 15, RF 0 for use with flagyl if nausea noted -Encouraged to call if any questions or concerns arise prior to next scheduled office visit.  -Discharged to home in improved condition  Cherre RobinsJessica L Aranda Bihm CNM, MSN 08/01/2017 1:48 AM

## 2017-08-01 NOTE — Progress Notes (Signed)
Jessica Emly CNM in to discuss test results and d/c plan. Written and verbal d/c instructions given and understanding voiced. 

## 2017-08-01 NOTE — Progress Notes (Signed)
Gerrit HeckJessica Emly CNM notified of pt's admission and status. Aware of abd cramping with no bleeding. Positive upt. Will see pt

## 2017-08-01 NOTE — Discharge Instructions (Signed)
First Trimester of Pregnancy The first trimester of pregnancy is from week 1 until the end of week 13 (months 1 through 3). During this time, your baby will begin to develop inside you. At 6-8 weeks, the eyes and face are formed, and the heartbeat can be seen on ultrasound. At the end of 12 weeks, all the baby's organs are formed. Prenatal care is all the medical care you receive before the birth of your baby. Make sure you get good prenatal care and follow all of your doctor's instructions. Follow these instructions at home: Medicines  Take over-the-counter and prescription medicines only as told by your doctor. Some medicines are safe and some medicines are not safe during pregnancy.  Take a prenatal vitamin that contains at least 600 micrograms (mcg) of folic acid.  If you have trouble pooping (constipation), take medicine that will make your stool soft (stool softener) if your doctor approves. Eating and drinking  Eat regular, healthy meals.  Your doctor will tell you the amount of weight gain that is right for you.  Avoid raw meat and uncooked cheese.  If you feel sick to your stomach (nauseous) or throw up (vomit): ? Eat 4 or 5 small meals a day instead of 3 large meals. ? Try eating a few soda crackers. ? Drink liquids between meals instead of during meals.  To prevent constipation: ? Eat foods that are high in fiber, like fresh fruits and vegetables, whole grains, and beans. ? Drink enough fluids to keep your pee (urine) clear or pale yellow. Activity  Exercise only as told by your doctor. Stop exercising if you have cramps or pain in your lower belly (abdomen) or low back.  Do not exercise if it is too hot, too humid, or if you are in a place of great height (high altitude).  Try to avoid standing for long periods of time. Move your legs often if you must stand in one place for a long time.  Avoid heavy lifting.  Wear low-heeled shoes. Sit and stand up straight.  You  can have sex unless your doctor tells you not to. Relieving pain and discomfort  Wear a good support bra if your breasts are sore.  Take warm water baths (sitz baths) to soothe pain or discomfort caused by hemorrhoids. Use hemorrhoid cream if your doctor says it is okay.  Rest with your legs raised if you have leg cramps or low back pain.  If you have puffy, bulging veins (varicose veins) in your legs: ? Wear support hose or compression stockings as told by your doctor. ? Raise (elevate) your feet for 15 minutes, 3-4 times a day. ? Limit salt in your food. Prenatal care  Schedule your prenatal visits by the twelfth week of pregnancy.  Write down your questions. Take them to your prenatal visits.  Keep all your prenatal visits as told by your doctor. This is important. Safety  Wear your seat belt at all times when driving.  Make a list of emergency phone numbers. The list should include numbers for family, friends, the hospital, and police and fire departments. General instructions  Ask your doctor for a referral to a local prenatal class. Begin classes no later than at the start of month 6 of your pregnancy.  Ask for help if you need counseling or if you need help with nutrition. Your doctor can give you advice or tell you where to go for help.  Do not use hot tubs, steam rooms, or   saunas.  Do not douche or use tampons or scented sanitary pads.  Do not cross your legs for long periods of time.  Avoid all herbs and alcohol. Avoid drugs that are not approved by your doctor.  Do not use any tobacco products, including cigarettes, chewing tobacco, and electronic cigarettes. If you need help quitting, ask your doctor. You may get counseling or other support to help you quit.  Avoid cat litter boxes and soil used by cats. These carry germs that can cause birth defects in the baby and can cause a loss of your baby (miscarriage) or stillbirth.  Visit your dentist. At home, brush  your teeth with a soft toothbrush. Be gentle when you floss. Contact a doctor if:  You are dizzy.  You have mild cramps or pressure in your lower belly.  You have a nagging pain in your belly area.  You continue to feel sick to your stomach, you throw up, or you have watery poop (diarrhea).  You have a bad smelling fluid coming from your vagina.  You have pain when you pee (urinate).  You have increased puffiness (swelling) in your face, hands, legs, or ankles. Get help right away if:  You have a fever.  You are leaking fluid from your vagina.  You have spotting or bleeding from your vagina.  You have very bad belly cramping or pain.  You gain or lose weight rapidly.  You throw up blood. It may look like coffee grounds.  You are around people who have German measles, fifth disease, or chickenpox.  You have a very bad headache.  You have shortness of breath.  You have any kind of trauma, such as from a fall or a car accident. Summary  The first trimester of pregnancy is from week 1 until the end of week 13 (months 1 through 3).  To take care of yourself and your unborn baby, you will need to eat healthy meals, take medicines only if your doctor tells you to do so, and do activities that are safe for you and your baby.  Keep all follow-up visits as told by your doctor. This is important as your doctor will have to ensure that your baby is healthy and growing well. This information is not intended to replace advice given to you by your health care provider. Make sure you discuss any questions you have with your health care provider. Document Released: 02/24/2008 Document Revised: 09/15/2016 Document Reviewed: 09/15/2016 Elsevier Interactive Patient Education  2017 [employer]  

## 2017-08-02 LAB — GC/CHLAMYDIA PROBE AMP (~~LOC~~) NOT AT ARMC
CHLAMYDIA, DNA PROBE: NEGATIVE
NEISSERIA GONORRHEA: NEGATIVE

## 2017-08-09 LAB — OB RESULTS CONSOLE HEPATITIS B SURFACE ANTIGEN: Hepatitis B Surface Ag: NEGATIVE

## 2017-08-09 LAB — OB RESULTS CONSOLE GC/CHLAMYDIA
CHLAMYDIA, DNA PROBE: NEGATIVE
GC PROBE AMP, GENITAL: NEGATIVE

## 2017-08-09 LAB — OB RESULTS CONSOLE ANTIBODY SCREEN: Antibody Screen: NEGATIVE

## 2017-08-09 LAB — OB RESULTS CONSOLE ABO/RH: RH TYPE: POSITIVE

## 2017-08-09 LAB — OB RESULTS CONSOLE RPR: RPR: NONREACTIVE

## 2017-08-09 LAB — OB RESULTS CONSOLE RUBELLA ANTIBODY, IGM: Rubella: IMMUNE

## 2017-08-09 LAB — OB RESULTS CONSOLE HIV ANTIBODY (ROUTINE TESTING): HIV: NONREACTIVE

## 2017-09-21 NOTE — L&D Delivery Note (Addendum)
Delivery Note   03/23/2018  Date of delivery: 03/23/18  Kathleen Vega, 27 y.o., @ 4336w5d,  G2P1001, who was admitted for Active phase labor.. I was called to the room when she progressed +2 station in the second stage of labor.  She pushed for 30min.  She delivered a viable infant, cephalic and restituted to the LOT position over an intact perineum.  A nuchal cord   was not identified. The baby was placed on maternal abdomen while initial step of NRP were perfmored (Dry, Stimulated, and warmed). Hat placed on baby for thermoregulation. Delayed cord clamping was performed for 2 minutes.  Cord double clamped and cut.  Cord cut by Father. Apgar scores were 8 and 9. The placenta delivered spontaneously, shultz, with a 3 vessel cord.  Inspection revealed none. An examination of the vaginal vault and cervix was free from lacerations. The uterus was firm, but bleeding was brisk and I called a PPH. Pitocin was going IV, then 1000mcg cytotec was placed rectally, while the rn was preparing to given Methergine IM. Bleeding quickly stooped, hemostasis was found and the pt was in no NAD.  Placenta and umbilical artery blood gas were not sent.  Pt was hemodynamically stable.  Mom and baby skin to skin following delivery. Left in stable condition.  Maternal Info: Anesthesia:Epidural Episiotomy: No Lacerations:  No Suture Repair: No Est. Blood Loss (mL):  1000  Newborn Info: Baby Sex: female Circumcision: NO Babies Name: Kathleen Vega APGAR (1 MIN): 8   APGAR (5 MINS): 9   APGAR (10 MINS):     Mom to postpartum.  Baby to Couplet care / Skin to Skin. Dr Mora ApplPinn was present for delivery and [facility].   Blandina Renaldo, CNM, NP-C @TODAY @ 4:13 PM

## 2018-03-01 LAB — OB RESULTS CONSOLE GBS: STREP GROUP B AG: NEGATIVE

## 2018-03-18 ENCOUNTER — Telehealth (HOSPITAL_COMMUNITY): Payer: Self-pay | Admitting: *Deleted

## 2018-03-18 NOTE — Telephone Encounter (Signed)
Preadmission screen  

## 2018-03-22 ENCOUNTER — Other Ambulatory Visit: Payer: Self-pay | Admitting: Obstetrics and Gynecology

## 2018-03-23 ENCOUNTER — Inpatient Hospital Stay (HOSPITAL_COMMUNITY): Payer: 59 | Admitting: Anesthesiology

## 2018-03-23 ENCOUNTER — Inpatient Hospital Stay (HOSPITAL_COMMUNITY)
Admission: AD | Admit: 2018-03-23 | Discharge: 2018-03-24 | DRG: 806 | Disposition: A | Discharge status: Home / Self Care | Payer: 59 | Attending: Obstetrics & Gynecology | Admitting: Obstetrics & Gynecology

## 2018-03-23 ENCOUNTER — Encounter (HOSPITAL_COMMUNITY): Payer: Self-pay

## 2018-03-23 DIAGNOSIS — Z3A39 39 weeks gestation of pregnancy: Secondary | ICD-10-CM

## 2018-03-23 DIAGNOSIS — Z3483 Encounter for supervision of other normal pregnancy, third trimester: Secondary | ICD-10-CM | POA: Diagnosis present

## 2018-03-23 DIAGNOSIS — O99214 Obesity complicating childbirth: Secondary | ICD-10-CM | POA: Diagnosis present

## 2018-03-23 DIAGNOSIS — D649 Anemia, unspecified: Secondary | ICD-10-CM | POA: Diagnosis present

## 2018-03-23 DIAGNOSIS — O9902 Anemia complicating childbirth: Secondary | ICD-10-CM | POA: Diagnosis present

## 2018-03-23 LAB — CBC WITH DIFFERENTIAL/PLATELET
Basophils Absolute: 0 10*3/uL (ref 0.0–0.1)
Basophils Relative: 0 %
EOS ABS: 0 10*3/uL (ref 0.0–0.7)
Eosinophils Relative: 0 %
HCT: 33.6 % — ABNORMAL LOW (ref 36.0–46.0)
Hemoglobin: 10.8 g/dL — ABNORMAL LOW (ref 12.0–15.0)
LYMPHS ABS: 1.9 10*3/uL (ref 0.7–4.0)
Lymphocytes Relative: 14 %
MCH: 22.8 pg — AB (ref 26.0–34.0)
MCHC: 32.1 g/dL (ref 30.0–36.0)
MCV: 70.9 fL — ABNORMAL LOW (ref 78.0–100.0)
MONO ABS: 0.7 10*3/uL (ref 0.1–1.0)
MONOS PCT: 5 %
Neutro Abs: 11.2 10*3/uL (ref 1.7–7.7)
Neutrophils Relative %: 81 %
Platelets: 139 10*3/uL — ABNORMAL LOW (ref 150–400)
RBC: 4.74 MIL/uL (ref 3.87–5.11)
RDW: 16 % — ABNORMAL HIGH (ref 11.5–15.5)
WBC: 13.9 10*3/uL — ABNORMAL HIGH (ref 4.0–10.5)

## 2018-03-23 LAB — CBC
HEMATOCRIT: 34.7 % — AB (ref 36.0–46.0)
HEMOGLOBIN: 11 g/dL — AB (ref 12.0–15.0)
MCH: 22.4 pg — ABNORMAL LOW (ref 26.0–34.0)
MCHC: 31.7 g/dL (ref 30.0–36.0)
MCV: 70.8 fL — ABNORMAL LOW (ref 78.0–100.0)
Platelets: 148 10*3/uL — ABNORMAL LOW (ref 150–400)
RBC: 4.9 MIL/uL (ref 3.87–5.11)
RDW: 16.4 % — ABNORMAL HIGH (ref 11.5–15.5)
WBC: 8.3 10*3/uL (ref 4.0–10.5)

## 2018-03-23 LAB — TYPE AND SCREEN
ABO/RH(D): O POS
Antibody Screen: NEGATIVE

## 2018-03-23 LAB — RPR: RPR Ser Ql: NONREACTIVE

## 2018-03-23 MED ORDER — PRENATAL MULTIVITAMIN CH
1.0000 | ORAL_TABLET | Freq: Every day | ORAL | Status: DC
  Administered 2018-03-24: 1 via ORAL
  Filled 2018-03-23: qty 1

## 2018-03-23 MED ORDER — ZOLPIDEM TARTRATE 5 MG PO TABS: 5.0000 mg | ORAL_TABLET | Freq: Every evening | ORAL | Status: DC | PRN

## 2018-03-23 MED ORDER — OXYCODONE-ACETAMINOPHEN 5-325 MG PO TABS: 1.0000 | ORAL_TABLET | ORAL | Status: DC | PRN

## 2018-03-23 MED ORDER — LACTATED RINGERS IV SOLN: 500.0000 mL | INTRAVENOUS | Status: DC | PRN

## 2018-03-23 MED ORDER — PRENATAL MULTIVITAMIN CH: 1.0000 | ORAL_TABLET | Freq: Every day | ORAL | Status: DC

## 2018-03-23 MED ORDER — ONDANSETRON HCL 4 MG PO TABS: 4.0000 mg | ORAL_TABLET | ORAL | Status: DC | PRN

## 2018-03-23 MED ORDER — FENTANYL 2.5 MCG/ML BUPIVACAINE 1/10 % EPIDURAL INFUSION (WH - ANES)
14.0000 mL/h | INTRAMUSCULAR | Status: DC | PRN
  Administered 2018-03-23: 14 mL/h via EPIDURAL
  Filled 2018-03-23: qty 100

## 2018-03-23 MED ORDER — METHYLERGONOVINE MALEATE 0.2 MG/ML IJ SOLN
INTRAMUSCULAR | Status: AC
  Administered 2018-03-23: 0.2 mg via INTRAMUSCULAR
  Filled 2018-03-23: qty 1

## 2018-03-23 MED ORDER — LACTATED RINGERS IV SOLN
INTRAVENOUS | Status: DC
  Administered 2018-03-23 (×2): via INTRAVENOUS

## 2018-03-23 MED ORDER — WITCH HAZEL-GLYCERIN EX PADS: 1.0000 "application " | MEDICATED_PAD | CUTANEOUS | Status: DC | PRN

## 2018-03-23 MED ORDER — TETANUS-DIPHTH-ACELL PERTUSSIS 5-2.5-18.5 LF-MCG/0.5 IM SUSP: 0.5000 mL | Freq: Once | INTRAMUSCULAR | Status: DC

## 2018-03-23 MED ORDER — LIDOCAINE HCL (PF) 1 % IJ SOLN
INTRAMUSCULAR | Status: DC | PRN
  Administered 2018-03-23 (×2): 5 mL via EPIDURAL

## 2018-03-23 MED ORDER — PHENYLEPHRINE 40 MCG/ML (10ML) SYRINGE FOR IV PUSH (FOR BLOOD PRESSURE SUPPORT)
80.0000 ug | PREFILLED_SYRINGE | INTRAVENOUS | Status: DC | PRN
  Filled 2018-03-23: qty 5

## 2018-03-23 MED ORDER — IBUPROFEN 600 MG PO TABS
600.0000 mg | ORAL_TABLET | Freq: Four times a day (QID) | ORAL | Status: DC
  Administered 2018-03-23 – 2018-03-24 (×4): 600 mg via ORAL
  Filled 2018-03-23 (×4): qty 1

## 2018-03-23 MED ORDER — SENNOSIDES-DOCUSATE SODIUM 8.6-50 MG PO TABS
2.0000 | ORAL_TABLET | ORAL | Status: DC
  Administered 2018-03-23: 2 via ORAL
  Filled 2018-03-23: qty 2

## 2018-03-23 MED ORDER — ACETAMINOPHEN 325 MG PO TABS: 650.0000 mg | ORAL_TABLET | ORAL | Status: DC | PRN

## 2018-03-23 MED ORDER — OXYTOCIN 40 UNITS IN LACTATED RINGERS INFUSION - SIMPLE MED
2.5000 [IU]/h | INTRAVENOUS | Status: DC
  Filled 2018-03-23: qty 1000

## 2018-03-23 MED ORDER — EPHEDRINE 5 MG/ML INJ
10.0000 mg | INTRAVENOUS | Status: DC | PRN
  Filled 2018-03-23: qty 2

## 2018-03-23 MED ORDER — SOD CITRATE-CITRIC ACID 500-334 MG/5ML PO SOLN: 30.0000 mL | ORAL | Status: DC | PRN

## 2018-03-23 MED ORDER — ONDANSETRON HCL 4 MG/2ML IJ SOLN
4.0000 mg | Freq: Four times a day (QID) | INTRAMUSCULAR | Status: DC | PRN
  Administered 2018-03-23: 4 mg via INTRAVENOUS
  Filled 2018-03-23: qty 2

## 2018-03-23 MED ORDER — COCONUT OIL OIL: 1.0000 "application " | TOPICAL_OIL | Status: DC | PRN

## 2018-03-23 MED ORDER — MISOPROSTOL 200 MCG PO TABS
ORAL_TABLET | ORAL | Status: AC
  Administered 2018-03-23: 1000 ug via RECTAL
  Filled 2018-03-23: qty 5

## 2018-03-23 MED ORDER — DIPHENHYDRAMINE HCL 25 MG PO CAPS: 25.0000 mg | ORAL_CAPSULE | Freq: Four times a day (QID) | ORAL | Status: DC | PRN

## 2018-03-23 MED ORDER — OXYCODONE-ACETAMINOPHEN 5-325 MG PO TABS: 2.0000 | ORAL_TABLET | ORAL | Status: DC | PRN

## 2018-03-23 MED ORDER — PHENYLEPHRINE 40 MCG/ML (10ML) SYRINGE FOR IV PUSH (FOR BLOOD PRESSURE SUPPORT)
PREFILLED_SYRINGE | INTRAVENOUS | Status: AC
  Filled 2018-03-23: qty 20

## 2018-03-23 MED ORDER — LACTATED RINGERS IV SOLN
500.0000 mL | Freq: Once | INTRAVENOUS | Status: AC
  Administered 2018-03-23: 500 mL via INTRAVENOUS

## 2018-03-23 MED ORDER — DIBUCAINE 1 % RE OINT: 1.0000 "application " | TOPICAL_OINTMENT | RECTAL | Status: DC | PRN

## 2018-03-23 MED ORDER — BENZOCAINE-MENTHOL 20-0.5 % EX AERO: 1.0000 "application " | INHALATION_SPRAY | CUTANEOUS | Status: DC | PRN

## 2018-03-23 MED ORDER — OXYTOCIN BOLUS FROM INFUSION
500.0000 mL | Freq: Once | INTRAVENOUS | Status: AC
  Administered 2018-03-23: 500 mL via INTRAVENOUS

## 2018-03-23 MED ORDER — LIDOCAINE HCL (PF) 1 % IJ SOLN
30.0000 mL | INTRAMUSCULAR | Status: DC | PRN
  Filled 2018-03-23: qty 30

## 2018-03-23 MED ORDER — ONDANSETRON HCL 4 MG/2ML IJ SOLN: 4.0000 mg | INTRAMUSCULAR | Status: DC | PRN

## 2018-03-23 MED ORDER — SIMETHICONE 80 MG PO CHEW: 80.0000 mg | CHEWABLE_TABLET | ORAL | Status: DC | PRN

## 2018-03-23 MED ORDER — DIPHENHYDRAMINE HCL 50 MG/ML IJ SOLN: 12.5000 mg | INTRAMUSCULAR | Status: DC | PRN

## 2018-03-23 NOTE — MAU Note (Signed)
Pt reporting cntrx 2 mins apart since 0400. Denies LOF or bleeding. +FM

## 2018-03-23 NOTE — Anesthesia Pain Management Evaluation Note (Signed)
  CRNA Pain Management Visit Note  Patient: Towanda Malkiniffany Holton, 27 y.o., female  "Hello I am a [facility] of the anesthesia [facility] at [facility]. We have an anesthesia team available at all times to provide care throughout the hospital, including epidural management and anesthesia for C-section. I don't know your plan for the delivery whether it a natural birth, water birth, IV sedation, nitrous supplementation, doula or epidural, but we want to meet your pain goals."   1.Was your pain managed to your expectations on prior hospitalizations?   Yes   2.What is your expectation for pain management during this hospitalization?     Epidural  3.How can we help you reach that goal?   Record the patient's initial score and the patient's pain goal.   Pain: 0  Pain Goal: 6 [facility] wants you to be able to say your pain was always managed very well.  Laban EmperorMalinova,Deetya Drouillard Hristova 03/23/2018

## 2018-03-23 NOTE — Progress Notes (Signed)
Labor Progress Note  Subjective: Kathleen Vega, [age] y.o., G2P1001, with an IUP @ 4224w5d, presented for Normal spontaneous labor . Pt resting in bed quietly with husband at bedside comfortable with epidural placement. Pt aware of plan of care and denies any further questions.  Patient Active Problem List   Diagnosis Date Noted  . Normal labor and delivery 03/23/2018  . Vaginal delivery 04/19/2016  . Post-dates pregnancy 04/18/2016  . Anemia affecting pregnancy in third trimester 04/18/2016    Objective: BP 118/72   Pulse 60   Temp 98.4 F (36.9 C) (Oral)   Resp 18   Ht 5\' 4"  (1.626 m)   Wt 93.9 kg (207 lb)   LMP 06/18/2017   SpO2 98%   BMI 35.53 kg/m  No intake/output data recorded. No intake/output data recorded. NST: FHR baseline 135s bpm, Variability: moderate, Accelerations:present, Decelerations:  Present Early= Cat 1/Reactive CTX:  irregular, every 2 minutes, lasting 70 seconds, moderate to palpate  Uterus gravid, soft non tender, moderate to palpate with contractions.  SVE:  Dilation: 8 Effacement (%): 90 Station: 0 Exam by:: J Natividad Schlosser  AROM by Dr Mora ApplPinn: Clear fluids, fetus tolerated well.   Assessment:  Kathleen Vega, 27 y.o., G2P1001, with an IUP @ 6824w5d, presented for normal spontaneous labor.  Patient Active Problem List   Diagnosis Date Noted  . Normal labor and delivery 03/23/2018  . Vaginal delivery 04/19/2016  . Post-dates pregnancy 04/18/2016  . Anemia affecting pregnancy in third trimester 04/18/2016   NICHD: Category 1 Membranes:  AROM x 0 hrs, no s/s of infection  Pain management:               Epidural placement:  at 0900 on 07/03  Plan: Continue labor plan Continuous monitoring Rest Ambulate Frequent position changes to facilitate fetal rotation and descent. Pain: Epidural Will reassess with cervical exam at 1300 or earlier if necessary Anticipate labor progression and vaginal delivery.   Md Pinn aware of plan and verbalized agreement.    Dale DurhamJade Stevey Stapleton, NP-C, CNM, MSN 03/23/2018. 10:51 AM

## 2018-03-23 NOTE — H&P (Signed)
Kathleen Vega is a 27 y.o. female, G2P1001, IUP at 39.5 weeks, presenting for Normal spontaneous labor that began at 0400 with regular contraction every couple mins,m water intact. Pt endorse + Fm. Denies vaginal leakage. Denies vaginal bleeding. Pt transfer and admitted, paced breathing through contractions. Pt request epidural.   Patient Active Problem List   Diagnosis Date Noted  . Normal labor and delivery 03/23/2018  . Vaginal delivery 04/19/2016  . Post-dates pregnancy 04/18/2016  . Anemia affecting pregnancy in third trimester 04/18/2016    Prenatal Problem: Anemia being tx with po Iron   Prenatal meds: Medications Prior to Admission  Medication Sig Dispense Refill Last Dose  . Prenatal Vit-Fe Fumarate-FA (PRENATAL MULTIVITAMIN) TABS tablet Take 1 tablet by mouth daily.   03/22/2018 at Unknown time  . metroNIDAZOLE (FLAGYL) 500 MG tablet Take 1 tablet (500 mg total) 2 (two) times daily by mouth. 14 tablet 0   . promethazine (PHENERGAN) 12.5 MG tablet Take 1 tablet (12.5 mg total) every 6 (six) hours as needed by mouth for nausea or vomiting. 15 tablet 0     Past Medical History:  Diagnosis Date  . Anemia   . Headache   . Hx of varicella      No current facility-administered medications on file prior to encounter.    Current Outpatient Medications on File Prior to Encounter  Medication Sig Dispense Refill  . Prenatal Vit-Fe Fumarate-FA (PRENATAL MULTIVITAMIN) TABS tablet Take 1 tablet by mouth daily.    . metroNIDAZOLE (FLAGYL) 500 MG tablet Take 1 tablet (500 mg total) 2 (two) times daily by mouth. 14 tablet 0  . promethazine (PHENERGAN) 12.5 MG tablet Take 1 tablet (12.5 mg total) every 6 (six) hours as needed by mouth for nausea or vomiting. 15 tablet 0     No Known Allergies  History of present pregnancy: Pt Info/Preference:  Screening/Consents:  Labs:   EDD: Estimated Date of Delivery: 03/25/18  Establised: Patient's last menstrual period was 06/18/2017.  Anatomy  Scan: Date: 11/01/2017 Placenta Location: posterior Genetic Screen: Panoroma:Decllined  AFP:  First Tri: Quad:  Office: [facility]      Md: Dr Sallye Ober First PNV: 7.3 weeks Blood Type O/Positive/-- (11/19 0000)  Language: [place] Last PNV: 38.5 weeks Rhogam    Flu Vaccine:  Declined   Antibody Negative (11/19 0000)  TDaP vaccine Done at PNV   GTT: Early: Declined Third Trimester: Neg  Feeding Plan: Bottle BTL: No Rubella: Immune (11/19 0000)  Contraception: Undecided VBAC: No RPR: Nonreactive (11/19 0000)   Circumcision: No: BG: Jada   HBsAg: Negative (11/19 0000)  Pediatrician:  Dr Chestine Spore   HIV: Non-reactive (11/19 0000)   Prenatal Classes: No Additional [place]: No GBS: Negative (06/11 0000)(For PCN allergy, check sensitivities)       Chlamydia: Neg    MFM Referral/Consult:  GC: Neg  Support Person: Joe Husband   PAP: UNK  Pain Management: Epidural Neonatologist Referral:  Hgb Electrophoresis:  AA  Birth Plan: No   Hgb NOB: 11.4    28W: 9.5  US Anatomy:  OB History    Gravida  2   Para  1   Term  1   Preterm      AB      Living  1     SAB      TAB      Ectopic      Multiple  0   Live Births  1          Past  Medical History:  Diagnosis Date  . Anemia   . Headache   . Hx of varicella    Past Surgical History:  Procedure Laterality Date  . NO PAST SURGERIES     Family History: family history is not on file. Social History:  reports that she has never smoked. She has never used smokeless tobacco. She reports that she does not drink alcohol or use drugs.   Prenatal Transfer Tool  Maternal Diabetes: No Genetic Screening: Declined Maternal Ultrasounds/Referrals: Normal Fetal Ultrasounds or other Referrals:  None Maternal Substance Abuse:  No Significant Maternal Medications:  None Significant Maternal Lab Results: None  ROS:  Review of Systems  Gastrointestinal: Positive for abdominal pain.  All other systems reviewed and are negative.    Physical Exam: BP  120/66   Pulse (!) 59   Temp 98.4 F (36.9 C) (Oral)   Resp 18   Ht 5\' 4"  (1.626 m)   Wt 93.9 kg (207 lb)   LMP 06/18/2017   SpO2 98%   BMI 35.53 kg/m   Physical Exam  Constitutional: She is oriented to person, place, and time and well-developed, well-nourished, and in no distress.  HENT:  Head: Normocephalic and atraumatic.  Eyes: Pupils are equal, round, and reactive to light. Conjunctivae are normal.  Neck: Normal range of motion. Neck supple.  Cardiovascular: Normal rate, regular rhythm and normal heart sounds.  Pulmonary/Chest: Effort normal and breath sounds normal.  Abdominal: Soft. Bowel sounds are normal.  Genitourinary: Vagina normal and cervix normal.  Genitourinary Comments: Uterus adequate for vaginal delivery, gravida equal to dates Pelvic: proven to 8 pounds SVE: Deferred at this time, last check by MAU RN, 5/90/-2 BBOW. RN reported vertex verified by sutures.    Musculoskeletal: Normal range of motion.  Neurological: She is alert and oriented to person, place, and time.  Skin: Skin is warm and dry.  Psychiatric: Affect normal.  Nursing note and vitals reviewed.    NST: FHR baseline 140s bpm, Variability: moderate, Accelerations:present, Decelerations:  Absent= Cat 1/Reactive UC:   irregular, every 2 minutes, lasting 70sec, moderate to palpate SVE:   Dilation: 5 Effacement (%): 90 Station: -2 Exam by:: Polos RN, vertex verified by fetal sutures.  Leopold's: Position vertex, EFW 8.5 via leopold's.   Labs: Results for orders placed or performed during the hospital encounter of 03/23/18 (from the past 24 hour(s))  CBC     Status: Abnormal (Preliminary result)   Collection Time: 03/23/18  7:44 AM  Result Value Ref Range   WBC 8.3 4.0 - 10.5 K/uL   RBC 4.90 3.87 - 5.11 MIL/uL   Hemoglobin 11.0 (L) 12.0 - 15.0 g/dL   HCT 16.134.7 (L) 09.636.0 - 04.546.0 %   MCV 70.8 (L) 78.0 - 100.0 fL   MCH 22.4 (L) 26.0 - 34.0 pg   MCHC 31.7 30.0 - 36.0 g/dL   RDW 40.916.4 (H) 81.111.5 -  15.5 %   Platelets PENDING 150 - 400 K/uL    Imaging:  No results found.  MAU Course: Orders Placed This Encounter  Procedures  . CBC  . RPR  . Diet clear liquid Room service appropriate? Yes; Fluid consistency: Thin  . Contraction - monitoring  . External fetal heart monitoring  . Vaginal exam  . Vitals signs per unit policy  . Notify Physician  . Fetal monitoring per unit policy  . Activity as tolerated  . Cervical Exam  . Measure blood pressure post delivery every 15 min x 1 hour then  every 30 min x 1 hour  . Fundal check post delivery every 15 min x 1 hour then every 30 min x 1 hour  . If Rapid HIV test positive or known HIV positive: initiate AZT orders  . May in and out cath x 2 for inability to void  . Insert foley catheter  . Discontinue foley prior to vaginal delivery  . Order Rapid HIV per protocol if no results on chart  . Practitioner attestation of consent  . Patient may have epidural placement upon request  . May use local infiltration of 1% lidocaine (plain) to produce a skin wheal prior to insertion of IV catheter  . Notify in-house Anesthesia team of nausea and vomiting greater than 5 hours  . Assess for signs/symptoms of PIH/preeclampsia  . Place lab order for CBC if one has not been drawn in the past 6 hours for all patients with hypertensive disease, pre-eclampsia, eclampsia, thrombocytopenia or previous PLTC<150,000.  . Identify to Anesthesia if patient plans to have postpartum tubal ligation; do not remove epidural without discussion with Anesthesiologist  . Following Epidural Placement, re-bolus or re-dose monitor patient's BP and oxygen saturation every 5 minutes for 30 minutes  . RN to remain at bedside continuously for 30 minutes post epidural placement, post re-bolus / re-dose  . Call Anesthesiologist if the patient becomes short of breath or complains of heaviness in chest, chest pain, and/or unrelieved pain  . Call Anesthesiologist if the epidural  infusion is discontinued at any time  . Full code  . Oxygen therapy  . Nitrous Oxide 50%/Oxygen 50%  . Type and screen [facility] [place]  . Insert and maintain IV Line  . Admit to Inpatient (patient's expected length of stay will be greater than 2 midnights or inpatient only procedure)   Meds ordered this encounter  Medications  . lactated ringers infusion  . oxytocin (PITOCIN) IV BOLUS FROM BAG  . oxytocin (PITOCIN) IV infusion 40 units in LR 1000 mL - Premix  . lactated ringers infusion 500-1,000 mL  . acetaminophen (TYLENOL) tablet 650 mg  . oxyCODONE-acetaminophen (PERCOCET/ROXICET) 5-325 MG per tablet 1 tablet  . oxyCODONE-acetaminophen (PERCOCET/ROXICET) 5-325 MG per tablet 2 tablet  . ondansetron (ZOFRAN) injection 4 mg  . sodium citrate-citric acid (ORACIT) solution 30 mL  . lidocaine (PF) (XYLOCAINE) 1 % injection 30 mL  . ePHEDrine injection 10 mg  . PHENYLephrine 40 mcg/ml in normal saline Adult IV Push Syringe  . lactated ringers infusion 500 mL  . fentaNYL 2.5 mcg/ml w/bupivacaine 0.1% in NS epidural infusion (WH-ANES)  . diphenhydrAMINE (BENADRYL) injection 12.5 mg  . ePHEDrine injection 10 mg  . PHENYLephrine 40 mcg/ml in normal saline Adult IV Push Syringe  . phenylephrine 0.4-0.9 MG/10ML-% injection    Tona Sensing, [place]   : cabinet override    Assessment/Plan: Kathleen Vega is a 27 y.o. female, G2P1001, IUP at 39.5 weeks, presenting for spontaneous normal labor .   FWB: Cat 1 Fetal Tracing.   Plan: Admit to Birthing Suite per consult with Dr Mora Appl Routine [facility] orders Pain med/epidural prn Anticipate labor progression   Myria Steenbergen NP-C, CNM, MSN 03/23/2018, 8:34 AM

## 2018-03-23 NOTE — Anesthesia Preprocedure Evaluation (Signed)
Anesthesia Evaluation  Patient identified by MRN, date of birth, ID band Patient awake    Reviewed: Allergy & Precautions, NPO status , Patient's Chart, lab work & pertinent test results  History of Anesthesia Complications Negative for: history of anesthetic complications  Airway Mallampati: II  TM Distance: >3 FB Neck ROM: Full    Dental no notable dental hx. (+) Dental Advisory Given   Pulmonary neg pulmonary ROS,    Pulmonary exam normal        Cardiovascular negative cardio ROS Normal cardiovascular exam     Neuro/Psych negative neurological ROS  negative psych ROS   GI/Hepatic negative GI ROS, Neg liver ROS,   Endo/Other  Morbid obesity  Renal/GU negative Renal ROS  negative genitourinary   Musculoskeletal negative musculoskeletal ROS (+)   Abdominal   Peds negative pediatric ROS (+)  Hematology negative hematology ROS (+)   Anesthesia Other Findings   Reproductive/Obstetrics negative OB ROS                             Anesthesia Physical Anesthesia Plan  ASA: II  Anesthesia Plan: Epidural   Post-op Pain Management:    Induction:   PONV Risk Score and Plan:   Airway Management Planned: Natural Airway  Additional Equipment:   Intra-op Plan:   Post-operative Plan:   Informed Consent: I have reviewed the patients History and Physical, chart, labs and discussed the procedure including the risks, benefits and alternatives for the proposed anesthesia with the patient or authorized representative who has indicated his/her understanding and acceptance.   Dental advisory given  Plan Discussed with: Anesthesiologist  Anesthesia Plan Comments:         Anesthesia Quick Evaluation  

## 2018-03-23 NOTE — Progress Notes (Signed)
Labor Progress Note  Subjective: Kathleen Vega, 27 y.o., G2P1001, with an IUP @ 5155w5d, presented for Normal spontaneous labor . Pt resting in bed quietly with husband at bedside comfortable post epidural placement. Pt aware of plan of care and denies any further questions.  Patient Active Problem List   Diagnosis Date Noted  . Normal labor and delivery 03/23/2018  . Vaginal delivery 04/19/2016  . Post-dates pregnancy 04/18/2016  . Anemia affecting pregnancy in third trimester 04/18/2016    Objective: BP 118/67   Pulse 60   Temp 98.4 F (36.9 C) (Oral)   Resp 18   Ht 5\' 4"  (1.626 m)   Wt 93.9 kg (207 lb)   LMP 06/18/2017   SpO2 98%   BMI 35.53 kg/m  No intake/output data recorded. No intake/output data recorded. NST: FHR baseline 135s bpm, Variability: moderate, Accelerations:present, Decelerations:  Present Early= Cat 1/Reactive CTX:  irregular, every 2 minutes, lasting 70 seconds, moderate to palpate  Uterus gravid, soft non tender, moderate to palpate with contractions.  SVE:  Dilation: 7 Effacement (%): 90 Station: 0 Exam by:: A Showfety RN  AROM by Dr Mora ApplPinn: Clear fluids, fetus tolerated well.   Assessment:  Kathleen Vega, 27 y.o., G2P1001, with an IUP @ 9755w5d, presented for  Patient Active Problem List   Diagnosis Date Noted  . Normal labor and delivery 03/23/2018  . Vaginal delivery 04/19/2016  . Post-dates pregnancy 04/18/2016  . Anemia affecting pregnancy in third trimester 04/18/2016   NICHD: Category 1 Membranes:  AROM x 0 hrs, no s/s of infection   Pain management:               Epidural placement:  at 0900 on 07/03  Plan: Continue labor plan Continuous monitoring Rest Ambulate Frequent position changes to facilitate fetal rotation and descent. Pain: Epidural Will reassess with cervical exam at 1300 or earlier if necessary Anticipate labor progression and vaginal delivery.   Md Mora Applinn was in room for assessment AROM'ed and aware of plan and  verbalized agreement.   Dale DurhamJade Ladean Steinmeyer, NP-C, CNM, MSN 03/23/2018. 9:59 AM

## 2018-03-23 NOTE — Anesthesia Procedure Notes (Signed)
Epidural Patient location during procedure: OB Start time: 03/23/2018 8:51 AM End time: 03/23/2018 9:03 AM  Staffing Anesthesiologist: Heather RobertsSinger, Nai Dasch, MD Performed: anesthesiologist   Preanesthetic Checklist Completed: patient identified, site marked, pre-op evaluation, timeout performed, IV checked, risks and benefits discussed and monitors and equipment checked  Epidural Patient position: sitting Prep: DuraPrep Patient monitoring: heart rate, cardiac monitor, continuous pulse ox and blood pressure Approach: midline Location: L2-L3 Injection technique: LOR saline  Needle:  Needle type: Tuohy  Needle gauge: 17 G Needle length: 9 cm Needle insertion depth: 7 cm Catheter size: 20 Guage Catheter at skin depth: 12 cm Test dose: negative and Other  Assessment Events: blood not aspirated, injection not painful, no injection resistance and negative IV test  Additional Notes Informed consent obtained prior to proceeding including risk of failure, 1% risk of PDPH, risk of minor discomfort and bruising.  Discussed rare but serious complications including epidural abscess, permanent nerve injury, epidural hematoma.  Discussed alternatives to epidural analgesia and patient desires to proceed.  Timeout performed pre-procedure verifying patient name, procedure, and platelet count.  Patient tolerated procedure well.

## 2018-03-24 LAB — CBC
HCT: 29.8 % — ABNORMAL LOW (ref 36.0–46.0)
Hemoglobin: 9.6 g/dL — ABNORMAL LOW (ref 12.0–15.0)
MCH: 22.9 pg — ABNORMAL LOW (ref 26.0–34.0)
MCHC: 32.2 g/dL (ref 30.0–36.0)
MCV: 71 fL — AB (ref 78.0–100.0)
PLATELETS: 115 10*3/uL — AB (ref 150–400)
RBC: 4.2 MIL/uL (ref 3.87–5.11)
RDW: 16.2 % — AB (ref 11.5–15.5)
WBC: 10.5 10*3/uL (ref 4.0–10.5)

## 2018-03-24 MED ORDER — DOCUSATE SODIUM 100 MG PO CAPS
100.0000 mg | ORAL_CAPSULE | Freq: Every day | ORAL | Status: DC
  Filled 2018-03-24: qty 1

## 2018-03-24 MED ORDER — IBUPROFEN 600 MG PO TABS: 600.0000 mg | ORAL_TABLET | Freq: Four times a day (QID) | ORAL | 0 refills | Status: DC | PRN

## 2018-03-24 MED ORDER — FERROUS SULFATE 325 (65 FE) MG PO TABS: 325.0000 mg | ORAL_TABLET | Freq: Every day | ORAL | Status: DC

## 2018-03-24 MED ORDER — FERROUS SULFATE 325 (65 FE) MG PO TABS: 325.0000 mg | ORAL_TABLET | Freq: Every day | ORAL | 0 refills | Status: AC

## 2018-03-24 NOTE — Discharge Instructions (Signed)
Contraception Choices °Contraception, also called birth control, means things to use or ways to try not to get pregnant. °Hormonal birth control °This kind of birth control uses hormones. Here are some types of hormonal birth control: °· A tube that is put under skin of the arm (implant). The tube can stay in for as long as 3 years. °· Shots to get every 3 months (injections). °· Pills to take every day (birth control pills). °· A patch to change 1 time each week for 3 weeks (birth control patch). After that, the patch is taken off for 1 week. °· A ring to put in the vagina. The ring is left in for 3 weeks. Then it is taken out of the vagina for 1 week. Then a new ring is put in. °· Pills to take after unprotected sex (emergency birth control pills). ° °Barrier birth control °Here are some types of barrier birth control: °· A thin covering that is put on the penis before sex (female condom). The covering is thrown away after sex. °· A soft, loose covering that is put in the vagina before sex (female condom). The covering is thrown away after sex. °· A rubber bowl that sits over the cervix (diaphragm). The bowl must be made for you. The bowl is put into the vagina before sex. The bowl is left in for 6-8 hours after sex. It is taken out within 24 hours. °· A small, soft cup that fits over the cervix (cervical cap). The cup must be made for you. The cup can be left in for 6-8 hours after sex. It is taken out within 48 hours. °· A sponge that is put into the vagina before sex. It must be left in for at least 6 hours after sex. It must be taken out within 30 hours. Then it is thrown away. °· A chemical that kills or stops sperm from getting into the uterus (spermicide). It may be a pill, cream, jelly, or foam to put in the vagina. The chemical should be used at least 10-15 minutes before sex. ° °IUD (intrauterine) birth control °An IUD is a small, T-shaped piece of plastic. It is put inside the uterus. There are two  kinds: °· Hormone IUD. This kind can stay in for 3-5 years. °· Copper IUD. This kind can stay in for 10 years. ° °Permanent birth control °Here are some types of permanent birth control: °· Surgery to block the fallopian tubes. °· Having an insert put into each fallopian tube. °· Surgery to tie off the tubes that carry sperm (vasectomy). ° °Natural planning birth control °Here are some types of natural planning birth control: °· Not having sex on the days the woman could get pregnant. °· Using a calendar: °? To keep track of the length of each period. °? To find out what days pregnancy can happen. °? To plan to not have sex on days when pregnancy can happen. °· Watching for symptoms of ovulation and not having sex during ovulation. One way the woman can check for ovulation is to check her temperature. °· Waiting to have sex until after ovulation. ° °Summary °· Contraception, also called birth control, means things to use or ways to try not to get pregnant. °· Hormonal methods of birth control include implants, injections, pills, patches, vaginal rings, and emergency birth control pills. °· Barrier methods of birth control can include female condoms, female condoms, diaphragms, cervical caps, sponges, and spermicides. °· There are two types of   IUD (intrauterine device) birth control. An IUD can be put in a woman's uterus to prevent pregnancy for 3-5 years. °· Permanent sterilization can be done through a procedure for males, females, or both. °· Natural planning methods involve not having sex on the days when the woman could get pregnant. °This information is not intended to replace advice given to you by your health care provider. Make sure you discuss any questions you have with your health care provider. °Document Released: 07/05/2009 Document Revised: 09/17/2016 Document Reviewed: 09/17/2016 °Elsevier Interactive Patient Education © 2017 [employer] ° ° °Postpartum Care After Vaginal Delivery ° °The period of  time right after you deliver your newborn is called the postpartum period. °What kind of medical care will I receive? °· You may continue to receive fluids and medicines through an IV tube inserted into one of your veins. °· If an incision was made near your vagina (episiotomy) or if you had some vaginal tearing during delivery, cold compresses may be placed on your episiotomy or your tear. This helps to reduce pain and swelling. °· You may be given a squirt bottle to use when you go to the bathroom. You may use this until you are comfortable wiping as usual. To use the squirt bottle, follow these steps: °? Before you urinate, fill the squirt bottle with warm water. Do not use hot water. °? After you urinate, while you are sitting on the toilet, use the squirt bottle to rinse the area around your urethra and vaginal opening. This rinses away any urine and blood. °? You may do this instead of wiping. As you start healing, you may use the squirt bottle before wiping yourself. Make sure to wipe gently. °? Fill the squirt bottle with clean water every time you use the bathroom. °· You will be given sanitary pads to wear. °How can I expect to feel? °· You may not feel the need to urinate for several hours after delivery. °· You will have some soreness and pain in your abdomen and vagina. °· If you are breastfeeding, you may have uterine contractions every time you breastfeed for up to several weeks postpartum. Uterine contractions help your uterus return to its normal size. °· It is normal to have vaginal bleeding (lochia) after delivery. The amount and appearance of lochia is often similar to a menstrual period in the first week after delivery. It will gradually decrease over the next few weeks to a dry, yellow-brown discharge. For most women, lochia stops completely by 6-8 weeks after delivery. Vaginal bleeding can vary from woman to woman. °· Within the first few days after delivery, you may have breast engorgement.  This is when your breasts feel heavy, full, and uncomfortable. Your breasts may also throb and feel hard, tightly stretched, warm, and tender. After this occurs, you may have milk leaking from your breasts. Your health care provider can help you relieve discomfort due to breast engorgement. Breast engorgement should go away within a few days. °· You may feel more sad or worried than normal due to hormonal changes after delivery. These feelings should not last more than a few days. If these feelings do not go away after several days, speak with your health care provider. °How should I care for myself? °· Tell your health care provider if you have pain or discomfort. °· Drink enough water to keep your urine clear or pale yellow. °· Wash your hands thoroughly with soap and water for at least 20 seconds after   changing your sanitary pads, after using the toilet, and before holding or feeding your baby. °· If you are not breastfeeding, avoid touching your breasts a lot. Doing this can make your breasts produce more milk. °· If you become weak or lightheaded, or you feel like you might faint, ask for help before: °? Getting out of bed. °? Showering. °· Change your sanitary pads frequently. Watch for any changes in your flow, such as a sudden increase in volume, a change in color, the passing of large blood clots. If you pass a blood clot from your vagina, save it to show to your health care provider. Do not flush blood clots down the toilet without having your health care provider look at them. °· Make sure that all your vaccinations are up to date. This can help protect you and your baby from getting certain diseases. You may need to have immunizations done before you leave the hospital. °· If desired, talk with your health care provider about methods of family planning or birth control (contraception). °How can I start bonding with my baby? °Spending as much time as possible with your baby is very important. During this  time, you and your baby can get to know each other and develop a bond. Having your baby stay with you in your room (rooming in) can give you time to get to know your baby. Rooming in can also help you become comfortable caring for your baby. Breastfeeding can also help you bond with your baby. °How can I plan for returning home with my baby? °· Make sure that you have a car seat installed in your vehicle. °? Your car seat should be checked by a certified car seat installer to make sure that it is installed safely. °? Make sure that your baby fits into the car seat safely. °· Ask your health care provider any questions you have about caring for yourself or your baby. Make sure that you are able to contact your health care provider with any questions after leaving the hospital. °This information is not intended to replace advice given to you by your health care provider. Make sure you discuss any questions you have with your health care provider. °Document Released: 07/05/2007 Document Revised: 02/10/2016 Document Reviewed: 08/12/2015 °Elsevier Interactive Patient Education © 2018 [employer] ° ° °Postpartum Depression and Baby Blues °The postpartum period begins right after the birth of a baby. During this time, there is often a great amount of joy and excitement. It is also a time of many changes in the life of the parents. Regardless of how many times a mother gives birth, each child brings new challenges and dynamics to the family. It is not unusual to have feelings of excitement along with confusing shifts in moods, emotions, and thoughts. All mothers are at risk of developing postpartum depression or the "baby blues." These mood changes can occur right after giving birth, or they may occur many months after giving birth. The baby blues or postpartum depression can be mild or severe. Additionally, postpartum depression can go away rather quickly, or it can be a long-term condition. °What are the causes? °Raised  hormone levels and the rapid drop in those levels are thought to be a main cause of postpartum depression and the baby blues. A number of hormones change during and after pregnancy. Estrogen and progesterone usually decrease right after the delivery of your baby. The levels of thyroid hormone and various cortisol steroids also rapidly drop. Other factors   that play a role in these mood changes include major life events and genetics. °What increases the risk? °If you have any of the following risks for the baby blues or postpartum depression, know what symptoms to watch out for during the postpartum period. Risk factors that may increase the likelihood of getting the baby blues or postpartum depression include: °· Having a personal or family history of depression. °· Having depression while being pregnant. °· Having premenstrual mood issues or mood issues related to oral contraceptives. °· Having a lot of life stress. °· Having marital conflict. °· Lacking a social support network. °· Having a baby with special needs. °· Having health problems, such as diabetes. ° °What are the signs or symptoms? °Symptoms of baby blues include: °· Brief changes in mood, such as going from extreme happiness to sadness. °· Decreased concentration. °· Difficulty sleeping. °· Crying spells, tearfulness. °· Irritability. °· Anxiety. ° °Symptoms of postpartum depression typically begin within the first month after giving birth. These symptoms include: °· Difficulty sleeping or excessive sleepiness. °· Marked weight loss. °· Agitation. °· Feelings of worthlessness. °· Lack of interest in activity or food. ° °Postpartum psychosis is a very serious condition and can be dangerous. Fortunately, it is rare. Displaying any of the following symptoms is cause for immediate medical attention. Symptoms of postpartum psychosis include: °· Hallucinations and delusions. °· Bizarre or disorganized behavior. °· Confusion or disorientation. ° °How is this  diagnosed? °A diagnosis is made by an evaluation of your symptoms. There are no medical or lab tests that lead to a diagnosis, but there are various questionnaires that a health care provider may use to identify those with the baby blues, postpartum depression, or psychosis. Often, a screening tool called the Edinburgh Postnatal Depression Scale is used to diagnose depression in the postpartum period. °How is this treated? °The baby blues usually goes away on its own in 1-2 weeks. Social support is often all that is needed. You will be encouraged to get adequate sleep and rest. Occasionally, you may be given medicines to help you sleep. °Postpartum depression requires treatment because it can last several months or longer if it is not treated. Treatment may include individual or group therapy, medicine, or both to address any social, physiological, and psychological factors that may play a role in the depression. Regular exercise, a healthy diet, rest, and social support may also be strongly recommended. °Postpartum psychosis is more serious and needs treatment right away. Hospitalization is often needed. °Follow these instructions at home: °· Get as much rest as you can. Nap when the baby sleeps. °· Exercise regularly. Some women find yoga and walking to be beneficial. °· Eat a balanced and nourishing diet. °· Do little things that you enjoy. Have a cup of tea, take a bubble bath, read your favorite magazine, or listen to your favorite music. °· Avoid alcohol. °· Ask for help with household chores, cooking, grocery shopping, or running errands as needed. Do not try to do everything. °· Talk to people close to you about how you are feeling. Get support from your partner, family members, friends, or other new moms. °· Try to stay positive in how you think. Think about the things you are grateful for. °· Do not spend a lot of time alone. °· Only take over-the-counter or prescription medicine as directed by your health  care provider. °· Keep all your postpartum appointments. °· Let your health care provider know if you have any concerns. °Contact   a health care provider if: °You are having a reaction to or problems with your medicine. °Get help right away if: °· You have suicidal feelings. °· You think you may harm the baby or someone else. °This information is not intended to replace advice given to you by your health care provider. Make sure you discuss any questions you have with your health care provider. °Document Released: 06/11/2004 Document Revised: 02/13/2016 Document Reviewed: 06/19/2013 °Elsevier Interactive Patient Education © 2017 Elsevier Inc. ° ° °

## 2018-03-24 NOTE — Discharge Summary (Signed)
OB Discharge Summary     Patient Name: Kathleen Vega DOB: [birth year] MRN: [id number]  Date of admission: 03/23/2018 Delivering MD: Essie Hart   Date of discharge: 03/24/2018  Admitting diagnosis: CTX Intrauterine pregnancy: 107w5d     Secondary diagnosis:  Active Problems:   Normal labor and delivery  Discharge diagnosis: Term Pregnancy Delivered                                                                                                Post partum procedures:n/a  Augmentation: AROM  Complications: Hemorrhage>1047mL  Hospital course:  Onset of Labor With Vaginal Delivery     26 y.o. yo [id number] at [redacted]w[redacted]d was admitted in Active Labor on 03/23/2018. Patient had an uncomplicated labor course as follows:  Membrane Rupture Time/Date: 9:25 AM ,03/23/2018   Intrapartum Procedures: Episiotomy: None [1]                                         Lacerations:  None [1]  Patient had a delivery of a Viable infant. 03/23/2018  Information for the patient's newborn:  Harlee, Eckroth [[id number]]  Delivery Method: Vaginal, Spontaneous(Filed from Delivery Summary)    Pateint had an uncomplicated postpartum course.  She is ambulating, tolerating a regular diet, passing flatus, and urinating well. Patient is discharged home in stable condition on 03/24/18.   Physical exam  Vitals:   03/23/18 1611 03/23/18 1715 03/23/18 2115 03/24/18 0534  BP: (!) 142/84 138/74 129/66 115/70  Pulse: (!) 51 62 (!) 57 (!) 52  Resp: 16 16 16 16   Temp: 98.7 F (37.1 C) 98.2 F (36.8 C) 99 F (37.2 C) 98.8 F (37.1 C)  TempSrc: Oral  Oral Oral  SpO2:      Weight:      Height:       General: alert, cooperative and no distress Lochia: appropriate Uterine Fundus: firm Incision: N/A DVT Evaluation: No evidence of DVT seen on physical exam. Negative Homan's sign. No cords or calf tenderness. No significant calf/ankle edema. Labs: Lab Results  Component Value Date   WBC 10.5 03/24/2018   HGB 9.6 (L)  03/24/2018   HCT 29.8 (L) 03/24/2018   MCV 71.0 (L) 03/24/2018   PLT 115 (L) 03/24/2018   No flowsheet data found.  Discharge instruction: per After Visit Summary and "Baby and Me Booklet".  After visit meds:  Allergies as of 03/24/2018   No Known Allergies     Medication List    STOP taking these medications   metroNIDAZOLE 500 MG tablet Commonly known as:  FLAGYL   prenatal multivitamin Tabs tablet   promethazine 12.5 MG tablet Commonly known as:  PHENERGAN     TAKE these medications   ferrous sulfate 325 (65 FE) MG tablet Take 1 tablet (325 mg total) by mouth daily.   ibuprofen 600 MG tablet Commonly known as:  ADVIL,MOTRIN Take 1 tablet (600 mg total) by mouth every 6 (six) hours as needed for mild pain, moderate pain or  cramping.       Diet: routine diet. Take iron supplement for hemoglobin <10. Patient may use stool softeners OTC (Miralax or Colace) daily to prevent constipation.   Activity: Advance as tolerated. Pelvic rest for 6 weeks.   Outpatient follow up:6 weeks Follow up Appt:No future appointments. Follow up Visit:No follow-ups on file.  Postpartum contraception: Undecided  Newborn Data: Live born female  Birth Weight: 8 lb 1.5 oz (3671 g) APGAR: 8, 9  Newborn Delivery   Birth date/time:  03/23/2018 14:16:00 Delivery type:  Vaginal, Spontaneous     Baby Feeding: Bottle Disposition:home with mother   03/24/2018 Janeece RiggersEllis K Makalyn Lennox, CNM

## 2018-03-24 NOTE — Plan of Care (Signed)
Progressing appropriately. Encouraged to call for assistance as needed.  

## 2018-03-24 NOTE — Progress Notes (Signed)
Post Partum Day 1 Subjective: no complaints, up ad lib, voiding and tolerating PO. Patient did have postpartum hemorrhage but H&H are stable and patient is not symptomatic for anemia.   Objective: Vitals:   03/23/18 1611 03/23/18 1715 03/23/18 2115 03/24/18 0534  BP: (!) 142/84 138/74 129/66 115/70  Pulse: (!) 51 62 (!) 57 (!) 52  Resp: 16 16 16 16   Temp: 98.7 F (37.1 C) 98.2 F (36.8 C) 99 F (37.2 C) 98.8 F (37.1 C)  TempSrc: Oral  Oral Oral  SpO2:      Weight:      Height:        Physical Exam:  General: alert and cooperative Lochia: appropriate Uterine Fundus: firm Incision: n/a DVT Evaluation: No evidence of DVT seen on physical exam. Negative Homan's sign. No cords or calf tenderness. No significant calf/ankle edema.  Recent Labs    03/23/18 2006 03/24/18 0536  HGB 10.8* 9.6*  HCT 33.6* 29.8*    Assessment/Plan: Plan for discharge tomorrow, Breastfeeding and Contraception undecided  Will prescribe PO Iron QD with stool softener    LOS: 1 day   Janeece Riggersllis K Karma Ansley 03/24/2018, 9:12 AM

## 2018-03-25 ENCOUNTER — Inpatient Hospital Stay (HOSPITAL_COMMUNITY): Admission: RE | Admit: 2018-03-25 | Payer: 59 | Source: Ambulatory Visit

## 2018-03-25 NOTE — Anesthesia Postprocedure Evaluation (Signed)
Anesthesia Post Note  Patient: Kathleen Vega  Procedure(s) Performed: AN AD HOC LABOR EPIDURAL     Patient location during evaluation: Mother Baby Anesthesia Type: Epidural Level of consciousness: awake and alert Pain management: pain level controlled Vital Signs Assessment: post-procedure vital signs reviewed and stable Respiratory status: spontaneous breathing, nonlabored ventilation and respiratory function stable Cardiovascular status: stable Postop Assessment: no headache, no backache and epidural receding Anesthetic complications: no Comments: Chart review only             [name] DANIEL

## 2018-11-18 IMAGING — US US OB < 14 WEEKS - US OB TV
1 series · 15 of 28 positions shown · non-contrast
Comparison: None.

CLINICAL DATA: Abdominal cramping

EXAM:
OBSTETRIC <14 WK US AND TRANSVAGINAL OB US
TECHNIQUE: Both transabdominal and transvaginal ultrasound examinations were
performed for complete evaluation of the gestation as well as the
maternal uterus, adnexal regions, and pelvic cul-de-sac.
Transvaginal technique was performed to assess early pregnancy.

[Series 1: us ob < 14 weeks - us ob tv · 15 of 78 slices shown]
[im 1/78]
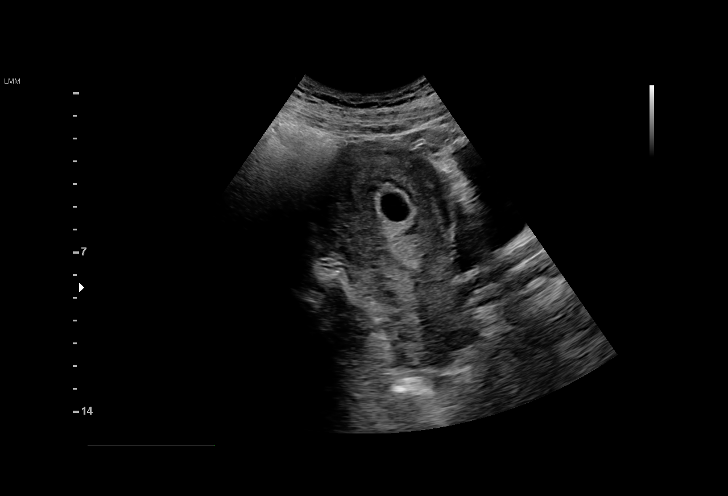
[im 6/78]
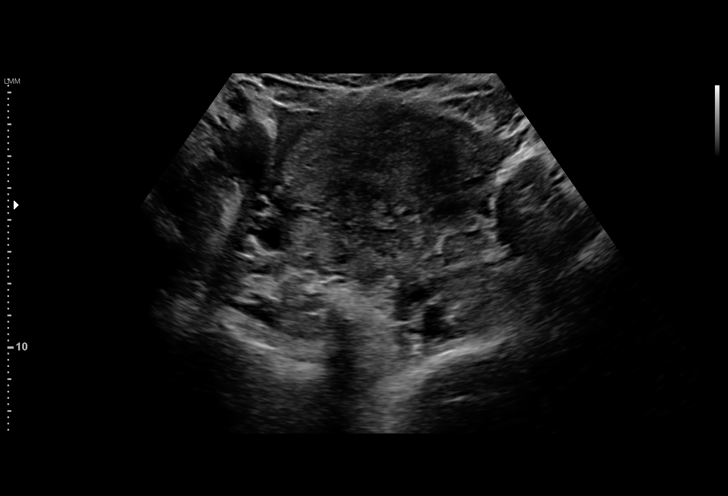
[im 12/78]
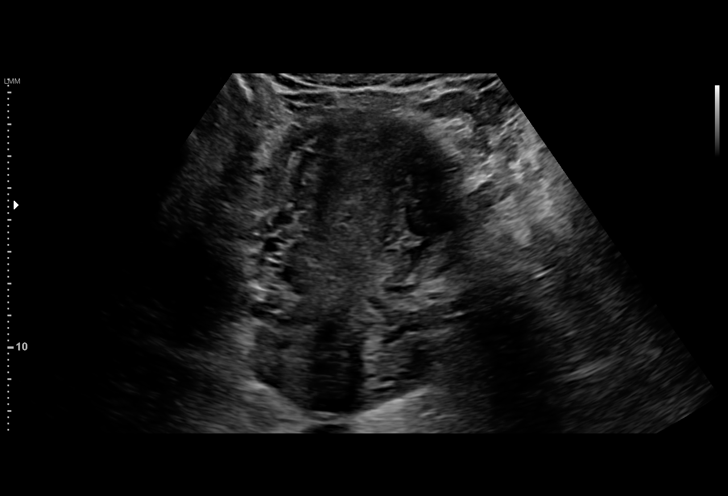
[im 18/78]
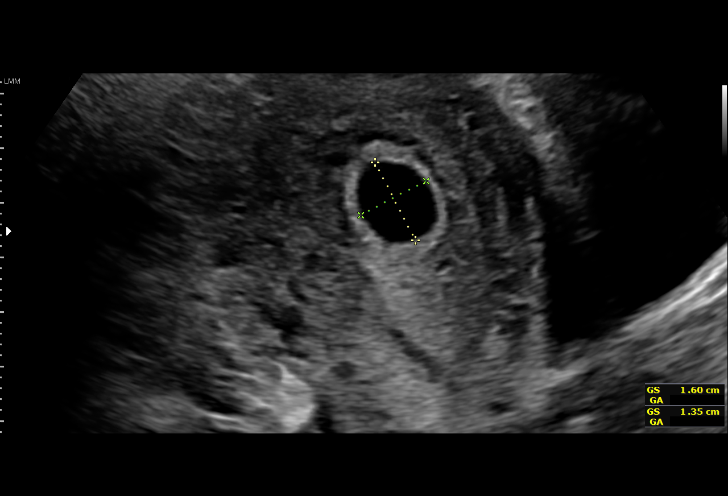
[im 23/78]
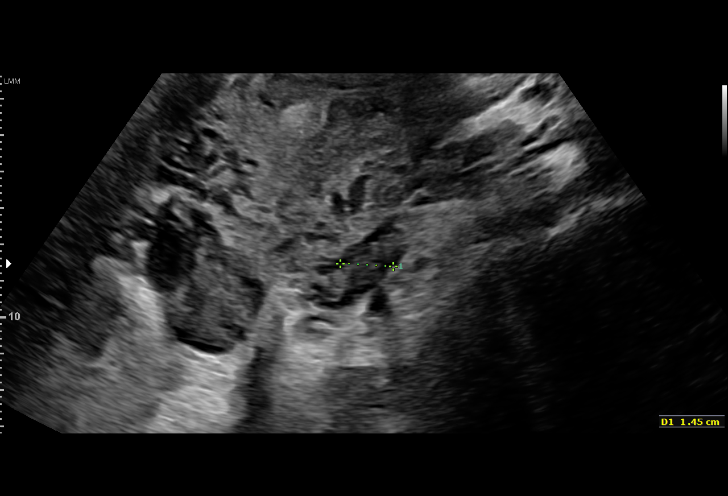
[im 29/78]
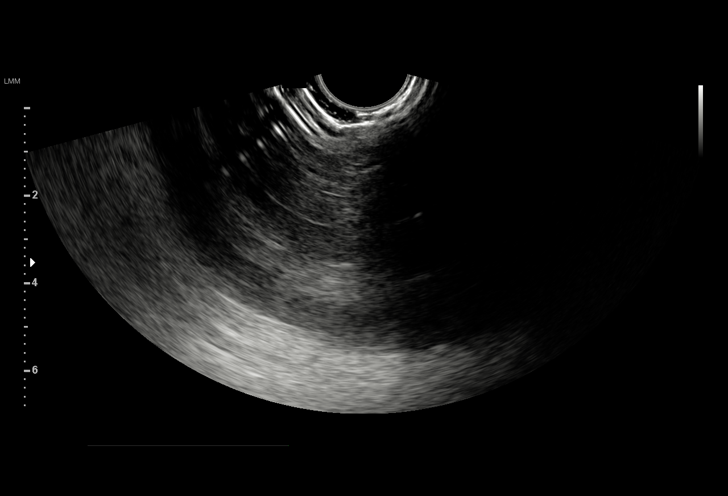
[im 35/78]
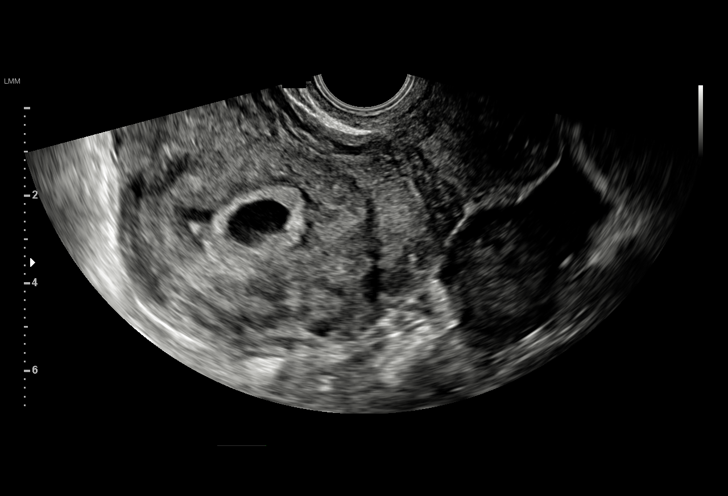
[im 40/78]
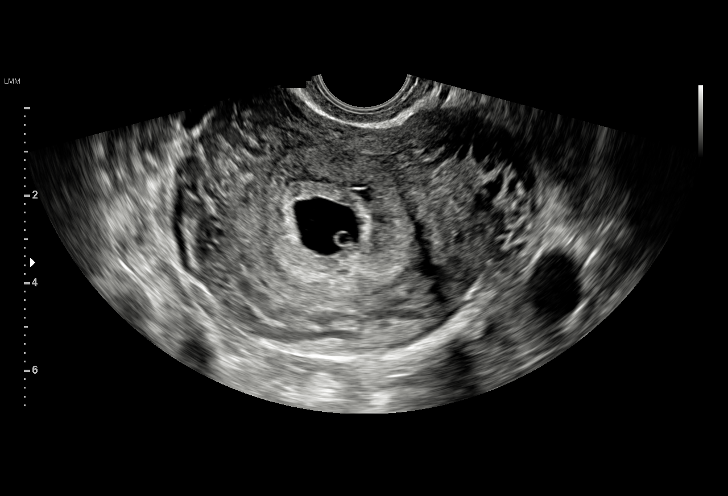
[im 43/78]
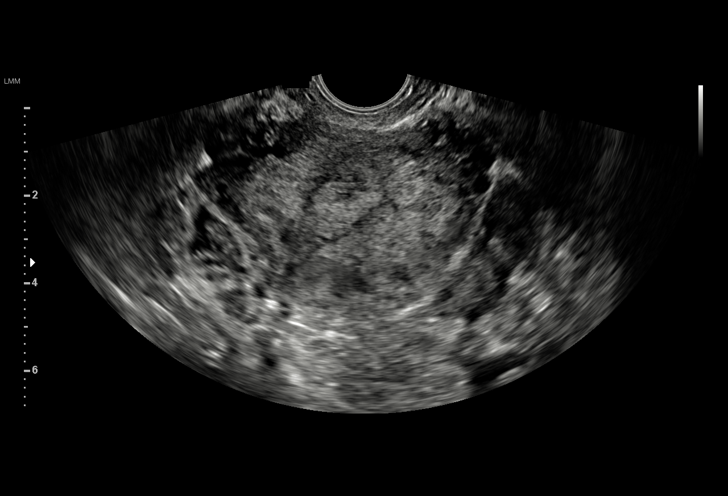
[im 49/78]
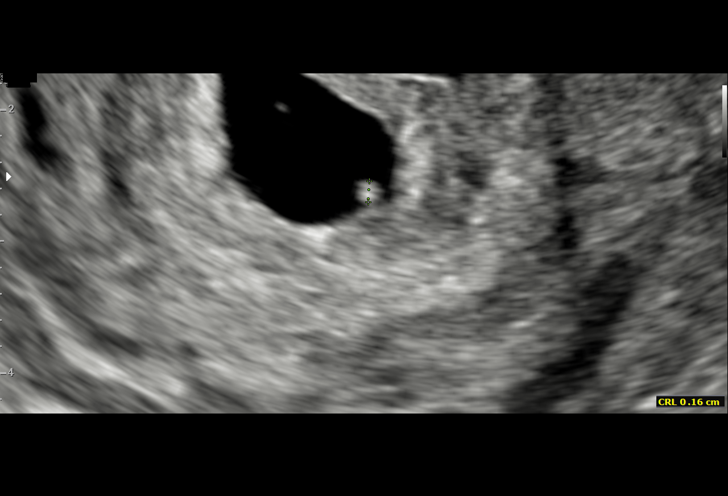
[im 55/78]
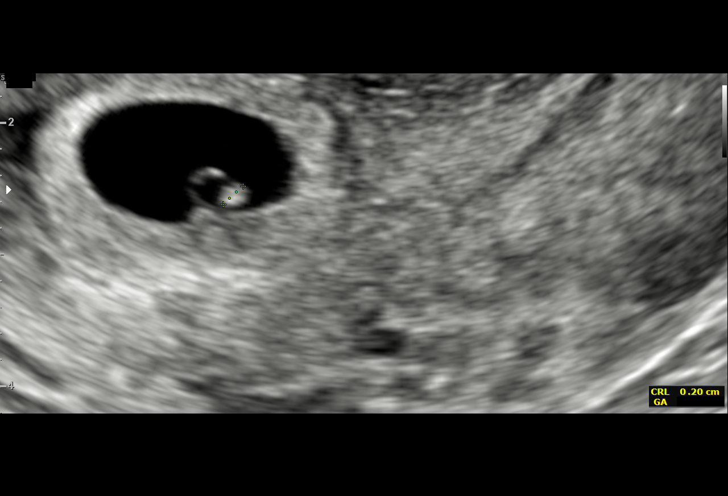
[im 60/78]
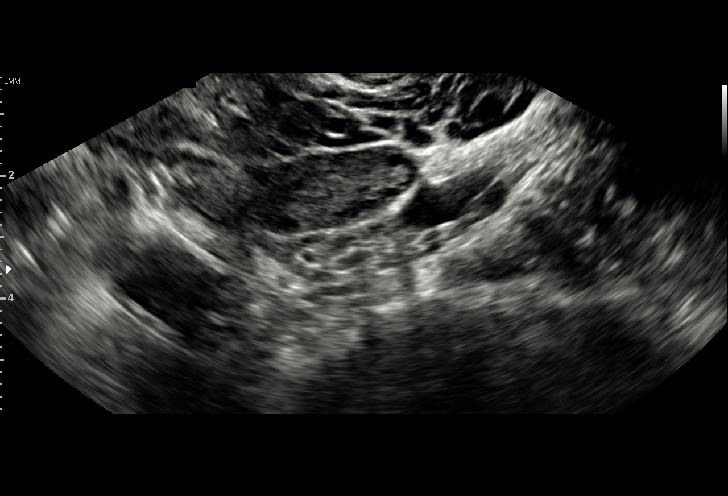
[im 66/78]
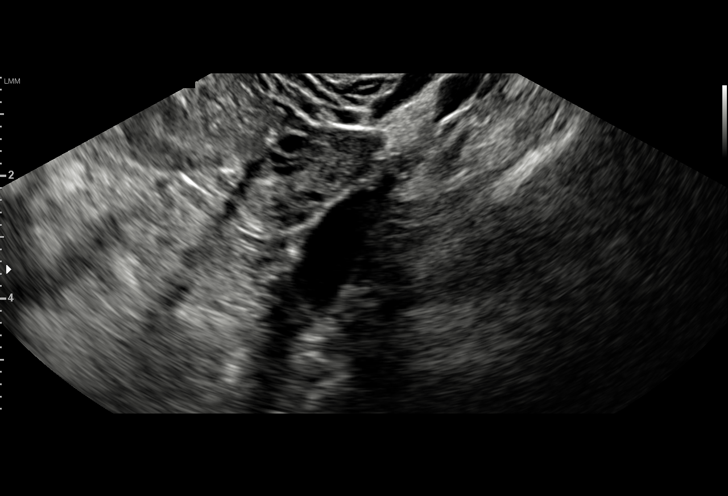
[im 72/78]
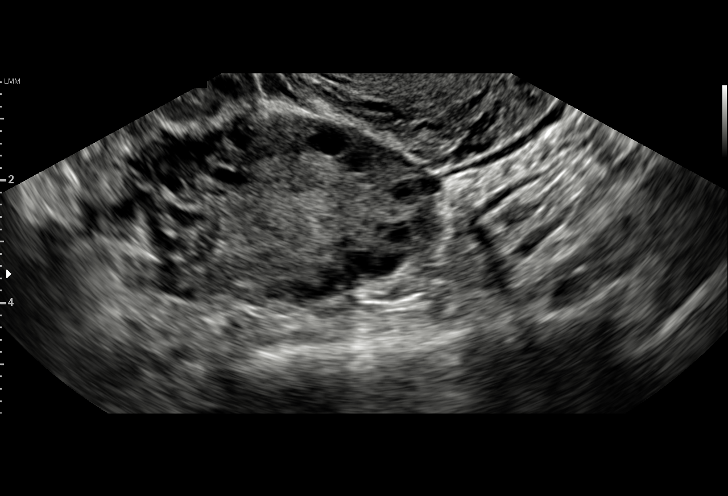
[im 78/78]
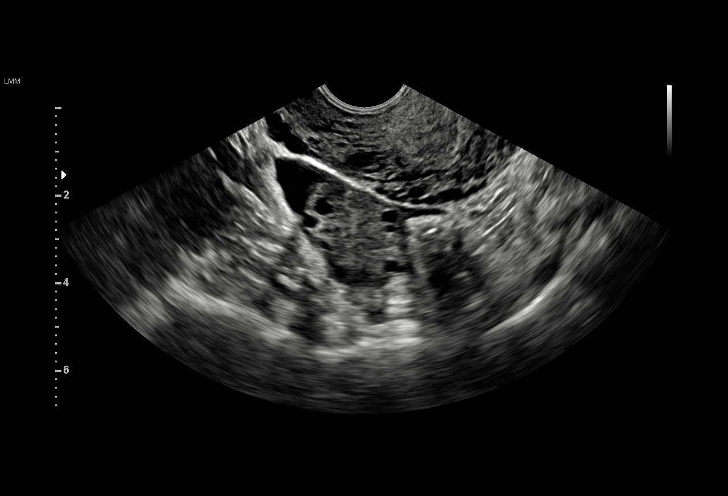

[15 of 28 positions shown; findings below may reference images not displayed]

FINDINGS: Intrauterine gestational sac: Single intrauterine gestation

Yolk sac:  Visible

Embryo:  Visible

Cardiac Activity: Visible

Heart Rate: 97  bpm

CRL:  2  mm   5 w   5 d                  US EDC: 03/29/2018

Subchorionic hemorrhage:  None visualized.

Maternal uterus/adnexae: Trace free fluid. Bilateral ovaries are
within normal limits. The right ovary measures 2.8 by 4.6 x 2.8 cm
and contains a probable corpus luteal cyst. The left ovary measures
1.8 by 3.6 x 1.3 cm.
IMPRESSION: 1. Single viable intrauterine pregnancy as above.
2. Trace free fluid.

## 2020-04-25 LAB — OB RESULTS CONSOLE HIV ANTIBODY (ROUTINE TESTING): HIV: NONREACTIVE

## 2020-04-25 LAB — OB RESULTS CONSOLE ANTIBODY SCREEN: Antibody Screen: NEGATIVE

## 2020-04-25 LAB — OB RESULTS CONSOLE GC/CHLAMYDIA
Chlamydia: NEGATIVE
Gonorrhea: NEGATIVE

## 2020-04-25 LAB — OB RESULTS CONSOLE HEPATITIS B SURFACE ANTIGEN: Hepatitis B Surface Ag: NEGATIVE

## 2020-04-25 LAB — OB RESULTS CONSOLE ABO/RH: RH Type: POSITIVE

## 2020-04-25 LAB — OB RESULTS CONSOLE RUBELLA ANTIBODY, IGM: Rubella: IMMUNE

## 2020-04-25 LAB — OB RESULTS CONSOLE RPR: RPR: NONREACTIVE

## 2020-09-21 NOTE — L&D Delivery Note (Addendum)
Delivery Note   Patient Name: Kathleen Vega DOB: 02-Aug-1991 MRN: [id number]  Date of admission: 11/22/2020 Delivering MD: Dale Hargill  Date of delivery: 11/22/20 Type of delivery: SVD  Newborn Data: Live born female  Birth Weight:   APGAR: 9, 9  Newborn Delivery   Birth date/time: 11/22/2020 10:45:00 Delivery type: Vaginal, Spontaneous     [name], 29 y.o., @ [redacted]w[redacted]d,  [id number], who was admitted for on 3/4 for spontaneous labor at 8cm, pt progressed to 8cm, reviewed R/B/A of AROM, pt stable, AROM, light meconium, tolerated well. Pt the progressed comfortable with epidural.. I was called to the room when she progressed 1+ station in the second stage of labor.  She pushed for 40hours/min.  She delivered a viable infant, cephalic and restituted to the ROA position over an intact perineum.  A nuchal cord   was not identified. The baby was placed on maternal abdomen while initial step of NRP were perfmored (Dry, Stimulated, and warmed). Hat placed on baby for thermoregulation. Delayed cord clamping was performed for 2 minutes.  Cord double clamped and cut.  Cord cut by father. Apgar scores were 9 and 9. Prophylactic Pitocin was started in the third stage of labor for active management. The placenta delivered spontaneously, shultz, with a 3 vessel cord and was sent to LD. The was brisk bleeding noted right before placenta delivery and right after, TXA, Cytotec and pitocin were hung, uterus then was firm and at umbilicus.  Inspection revealed none. An examination of the vaginal vault and cervix was free from lacerations. The uterus was firm, bleeding stable.   Placenta and umbilical artery blood gas were sent.  There were no complications during the procedure.  Mom and baby skin to skin following delivery. Left in stable condition. Pt been NPO since 8pm 3/3, desire tubal, epidural in place, starting HBG was 9.9 with EBL being , will check CBC now, if not less then 7 will give IV  venofer today and start PO Iron tomorrow. Pt asymptomatic and stable.   Maternal Info: Anesthesia: Epidural Episiotomy: NO Lacerations:  No Suture Repair: No Est. Blood Loss (mL):   Newborn Info:  Baby Sex: female Circumcision: N/A  APGAR (1 MIN): 9   APGAR (5 MINS): 9   APGAR (10 MINS):     Mom to postpartum.  Baby to Couplet care / Skin to Skin.  DR Mora Appl aware and updated.   [place], [place], NP-C 11/22/20 11:30 AM   Addendum: 11/22/20, 12:36 PM Called by RN reported another blood output, uterus firm, I ordered methergine IM now, then continue with PO methergine for next 24 hours. Dr Mora Appl updated. Plan for tubal at 2pm.

## 2020-11-11 ENCOUNTER — Encounter (HOSPITAL_COMMUNITY): Payer: Self-pay | Admitting: Obstetrics and Gynecology

## 2020-11-11 ENCOUNTER — Inpatient Hospital Stay (HOSPITAL_COMMUNITY)
Admission: AD | Admit: 2020-11-11 | Discharge: 2020-11-11 | Disposition: A | Discharge status: Home / Self Care | Payer: 59 | Attending: Obstetrics and Gynecology | Admitting: Obstetrics and Gynecology

## 2020-11-11 ENCOUNTER — Other Ambulatory Visit: Payer: Self-pay

## 2020-11-11 DIAGNOSIS — O471 False labor at or after 37 completed weeks of gestation: Secondary | ICD-10-CM | POA: Diagnosis not present

## 2020-11-11 DIAGNOSIS — Z3A39 39 weeks gestation of pregnancy: Secondary | ICD-10-CM | POA: Diagnosis not present

## 2020-11-11 DIAGNOSIS — O479 False labor, unspecified: Secondary | ICD-10-CM

## 2020-11-11 DIAGNOSIS — Z3689 Encounter for other specified antenatal screening: Secondary | ICD-10-CM

## 2020-11-11 NOTE — MAU Note (Signed)
Pt reports cramping and nausea. Called provider and was told to come in

## 2020-11-11 NOTE — MAU Provider Note (Signed)
  S: Ms. Taelar Gronewold is a 29 y.o. G3P2002 at [redacted]w[redacted]d  who presents to MAU today complaining contractions q 2 minutes since 1900. She denies vaginal bleeding. She denies LOF. She reports normal fetal movement.    O: BP 123/66   Pulse 88   Temp 98.6 F (37 C)   Resp 17   Ht 5\' 4"  (1.626 m)   Wt 99.8 kg   SpO2 100%   BMI 37.76 kg/m   Cervical exam:  Dilation: 1.5 Effacement (%): 30 Cervical Position: Posterior Station: Ballotable Presentation: Vertex Exam by:: [email or web address], RNC Fetal Monitoring: Baseline: 145 Variability: mod Accelerations: + Decelerations: no Contractions: irregular  A: SIUP at [redacted]w[redacted]d  False labor  P: Discharge home Follow up at [facility] as scheduled Labor precautions  [facility], CNM 11/11/2020 10:02 PM

## 2020-11-11 NOTE — Discharge Instructions (Signed)

## 2020-11-12 ENCOUNTER — Encounter (HOSPITAL_COMMUNITY): Payer: Self-pay | Admitting: Obstetrics and Gynecology

## 2020-11-12 ENCOUNTER — Inpatient Hospital Stay (HOSPITAL_COMMUNITY)
Admission: AD | Admit: 2020-11-12 | Discharge: 2020-11-12 | Disposition: A | Discharge status: Home / Self Care | Payer: 59 | Attending: Obstetrics and Gynecology | Admitting: Obstetrics and Gynecology

## 2020-11-12 DIAGNOSIS — O471 False labor at or after 37 completed weeks of gestation: Secondary | ICD-10-CM | POA: Diagnosis not present

## 2020-11-12 DIAGNOSIS — Z3A39 39 weeks gestation of pregnancy: Secondary | ICD-10-CM | POA: Insufficient documentation

## 2020-11-12 DIAGNOSIS — O479 False labor, unspecified: Secondary | ICD-10-CM

## 2020-11-12 NOTE — Discharge Instructions (Signed)

## 2020-11-12 NOTE — MAU Note (Signed)
Patient reports worsening contractions with an increase in rectal pressure since previous visit. Patient denies bleeding or leaking of fluid. Patient reports positive fetal movement. Triage assessment complete. Labor Eval to begin

## 2020-11-15 ENCOUNTER — Other Ambulatory Visit: Payer: Self-pay | Admitting: Obstetrics & Gynecology

## 2020-11-15 ENCOUNTER — Telehealth (HOSPITAL_COMMUNITY): Payer: Self-pay | Admitting: *Deleted

## 2020-11-15 NOTE — Telephone Encounter (Signed)
Preadmission screen  

## 2020-11-18 ENCOUNTER — Telehealth (HOSPITAL_COMMUNITY): Payer: Self-pay | Admitting: *Deleted

## 2020-11-18 NOTE — Telephone Encounter (Signed)
Preadmission screen  

## 2020-11-19 ENCOUNTER — Encounter (HOSPITAL_COMMUNITY): Payer: Self-pay | Admitting: *Deleted

## 2020-11-19 ENCOUNTER — Telehealth (HOSPITAL_COMMUNITY): Payer: Self-pay | Admitting: *Deleted

## 2020-11-19 NOTE — Telephone Encounter (Signed)
Preadmission screen  

## 2020-11-22 ENCOUNTER — Inpatient Hospital Stay (HOSPITAL_COMMUNITY): Payer: 59 | Admitting: Anesthesiology

## 2020-11-22 ENCOUNTER — Inpatient Hospital Stay (HOSPITAL_COMMUNITY)
Admission: AD | Admit: 2020-11-22 | Discharge: 2020-11-24 | DRG: 796 | Disposition: A | Discharge status: Home / Self Care | Payer: 59 | Attending: Obstetrics & Gynecology | Admitting: Obstetrics & Gynecology

## 2020-11-22 ENCOUNTER — Other Ambulatory Visit: Payer: Self-pay

## 2020-11-22 ENCOUNTER — Other Ambulatory Visit (HOSPITAL_COMMUNITY): Payer: 59 | Attending: Obstetrics & Gynecology

## 2020-11-22 ENCOUNTER — Encounter (HOSPITAL_COMMUNITY)
Admission: AD | Disposition: A | Discharge status: Home / Self Care | Payer: Self-pay | Source: Home / Self Care | Attending: Obstetrics & Gynecology

## 2020-11-22 ENCOUNTER — Encounter (HOSPITAL_COMMUNITY): Payer: Self-pay | Admitting: Obstetrics and Gynecology

## 2020-11-22 DIAGNOSIS — O26893 Other specified pregnancy related conditions, third trimester: Secondary | ICD-10-CM | POA: Diagnosis present

## 2020-11-22 DIAGNOSIS — O9081 Anemia of the puerperium: Secondary | ICD-10-CM | POA: Diagnosis not present

## 2020-11-22 DIAGNOSIS — O864 Pyrexia of unknown origin following delivery: Secondary | ICD-10-CM | POA: Diagnosis present

## 2020-11-22 DIAGNOSIS — Z3A40 40 weeks gestation of pregnancy: Secondary | ICD-10-CM | POA: Diagnosis not present

## 2020-11-22 DIAGNOSIS — Z302 Encounter for sterilization: Secondary | ICD-10-CM | POA: Diagnosis not present

## 2020-11-22 DIAGNOSIS — Z20822 Contact with and (suspected) exposure to covid-19: Secondary | ICD-10-CM | POA: Diagnosis present

## 2020-11-22 DIAGNOSIS — N390 Urinary tract infection, site not specified: Secondary | ICD-10-CM | POA: Diagnosis present

## 2020-11-22 DIAGNOSIS — D62 Acute posthemorrhagic anemia: Secondary | ICD-10-CM | POA: Diagnosis not present

## 2020-11-22 DIAGNOSIS — Z9851 Tubal ligation status: Secondary | ICD-10-CM

## 2020-11-22 HISTORY — PX: TUBAL LIGATION: SHX77

## 2020-11-22 LAB — CBC WITH DIFFERENTIAL/PLATELET
Abs Immature Granulocytes: 0.06 10*3/uL (ref 0.00–0.07)
Basophils Absolute: 0 10*3/uL (ref 0.0–0.1)
Basophils Relative: 0 %
Eosinophils Absolute: 0 10*3/uL (ref 0.0–0.5)
Eosinophils Relative: 0 %
HCT: 30.9 % — ABNORMAL LOW (ref 36.0–46.0)
Hemoglobin: 9.5 g/dL — ABNORMAL LOW (ref 12.0–15.0)
Immature Granulocytes: 1 %
Lymphocytes Relative: 6 %
Lymphs Abs: 0.7 10*3/uL (ref 0.7–4.0)
MCH: 22.9 pg — ABNORMAL LOW (ref 26.0–34.0)
MCHC: 30.7 g/dL (ref 30.0–36.0)
MCV: 74.6 fL — ABNORMAL LOW (ref 80.0–100.0)
Monocytes Absolute: 0.6 10*3/uL (ref 0.1–1.0)
Monocytes Relative: 5 %
Neutro Abs: 10.3 10*3/uL — ABNORMAL HIGH (ref 1.7–7.7)
Neutrophils Relative %: 88 %
Platelets: 126 10*3/uL — ABNORMAL LOW (ref 150–400)
RBC: 4.14 MIL/uL (ref 3.87–5.11)
RDW: 15.9 % — ABNORMAL HIGH (ref 11.5–15.5)
WBC: 11.7 10*3/uL — ABNORMAL HIGH (ref 4.0–10.5)
nRBC: 0 % (ref 0.0–0.2)

## 2020-11-22 LAB — CBC
HCT: 32.5 % — ABNORMAL LOW (ref 36.0–46.0)
Hemoglobin: 9.9 g/dL — ABNORMAL LOW (ref 12.0–15.0)
MCH: 22.9 pg — ABNORMAL LOW (ref 26.0–34.0)
MCHC: 30.5 g/dL (ref 30.0–36.0)
MCV: 75.2 fL — ABNORMAL LOW (ref 80.0–100.0)
Platelets: 144 10*3/uL — ABNORMAL LOW (ref 150–400)
RBC: 4.32 MIL/uL (ref 3.87–5.11)
RDW: 15.9 % — ABNORMAL HIGH (ref 11.5–15.5)
WBC: 7.5 10*3/uL (ref 4.0–10.5)
nRBC: 0 % (ref 0.0–0.2)

## 2020-11-22 LAB — RESP PANEL BY RT-PCR (FLU A&B, COVID) ARPGX2
Influenza A by PCR: NEGATIVE
Influenza B by PCR: NEGATIVE
SARS Coronavirus 2 by RT PCR: NEGATIVE

## 2020-11-22 LAB — OB RESULTS CONSOLE GBS: GBS: NEGATIVE

## 2020-11-22 LAB — TYPE AND SCREEN
ABO/RH(D): O POS
Antibody Screen: NEGATIVE

## 2020-11-22 LAB — RPR: RPR Ser Ql: NONREACTIVE

## 2020-11-22 SURGERY — LIGATION, FALLOPIAN TUBE, POSTPARTUM
Anesthesia: Epidural | Wound class: Clean Contaminated

## 2020-11-22 MED ORDER — BUPIVACAINE HCL (PF) 0.25 % IJ SOLN
INTRAMUSCULAR | Status: DC | PRN
  Administered 2020-11-22: 1 mL via INTRATHECAL

## 2020-11-22 MED ORDER — LACTATED RINGERS IV SOLN: 500.0000 mL | Freq: Once | INTRAVENOUS | Status: DC

## 2020-11-22 MED ORDER — EPHEDRINE 5 MG/ML INJ: 10.0000 mg | INTRAVENOUS | Status: DC | PRN

## 2020-11-22 MED ORDER — METHYLERGONOVINE MALEATE 0.2 MG PO TABS
0.2000 mg | ORAL_TABLET | Freq: Four times a day (QID) | ORAL | Status: DC
  Administered 2020-11-22 – 2020-11-24 (×7): 0.2 mg via ORAL
  Filled 2020-11-22 (×7): qty 1

## 2020-11-22 MED ORDER — FENTANYL CITRATE (PF) 100 MCG/2ML IJ SOLN
INTRAMUSCULAR | Status: DC | PRN
  Administered 2020-11-22 (×2): 50 ug via INTRAVENOUS

## 2020-11-22 MED ORDER — LIDOCAINE-EPINEPHRINE 2 %-1:100000 IJ SOLN
INTRAMUSCULAR | Status: DC | PRN
  Administered 2020-11-22: 10 mL via INTRADERMAL
  Administered 2020-11-22 (×2): 5 mL via INTRADERMAL

## 2020-11-22 MED ORDER — SODIUM CHLORIDE 0.9 % IR SOLN
Status: DC | PRN
  Administered 2020-11-22 (×2): 1000 mL

## 2020-11-22 MED ORDER — LACTATED RINGERS IV SOLN: INTRAVENOUS | Status: DC

## 2020-11-22 MED ORDER — MAGNESIUM OXIDE 400 (241.3 MG) MG PO TABS
400.0000 mg | ORAL_TABLET | Freq: Every day | ORAL | Status: DC
  Administered 2020-11-23 – 2020-11-24 (×2): 400 mg via ORAL
  Filled 2020-11-22 (×2): qty 1

## 2020-11-22 MED ORDER — MIDAZOLAM HCL 5 MG/5ML IJ SOLN
INTRAMUSCULAR | Status: DC | PRN
  Administered 2020-11-22 (×2): 1 mg via INTRAVENOUS

## 2020-11-22 MED ORDER — BUPIVACAINE HCL (PF) 0.25 % IJ SOLN
INTRAMUSCULAR | Status: DC | PRN
  Administered 2020-11-22 (×2): 10 mL

## 2020-11-22 MED ORDER — MISOPROSTOL 200 MCG PO TABS
1000.0000 ug | ORAL_TABLET | Freq: Once | ORAL | Status: AC
  Administered 2020-11-22: 1000 ug via RECTAL

## 2020-11-22 MED ORDER — SIMETHICONE 80 MG PO CHEW: 80.0000 mg | CHEWABLE_TABLET | ORAL | Status: DC | PRN

## 2020-11-22 MED ORDER — PROMETHAZINE HCL 25 MG/ML IJ SOLN: 6.2500 mg | INTRAMUSCULAR | Status: DC | PRN

## 2020-11-22 MED ORDER — LIDOCAINE HCL (PF) 1 % IJ SOLN
INTRAMUSCULAR | Status: DC | PRN
  Administered 2020-11-22: 4 mL via EPIDURAL

## 2020-11-22 MED ORDER — OXYTOCIN-SODIUM CHLORIDE 30-0.9 UT/500ML-% IV SOLN
INTRAVENOUS | Status: AC
  Filled 2020-11-22: qty 500

## 2020-11-22 MED ORDER — BUPIVACAINE HCL (PF) 0.25 % IJ SOLN
INTRAMUSCULAR | Status: AC
  Filled 2020-11-22: qty 30

## 2020-11-22 MED ORDER — METHYLERGONOVINE MALEATE 0.2 MG/ML IJ SOLN
0.2000 mg | Freq: Once | INTRAMUSCULAR | Status: AC
  Administered 2020-11-22: 0.2 mg via INTRAMUSCULAR
  Filled 2020-11-22: qty 1

## 2020-11-22 MED ORDER — MISOPROSTOL 200 MCG PO TABS
ORAL_TABLET | ORAL | Status: AC
  Filled 2020-11-22: qty 5

## 2020-11-22 MED ORDER — PHENYLEPHRINE 40 MCG/ML (10ML) SYRINGE FOR IV PUSH (FOR BLOOD PRESSURE SUPPORT): 80.0000 ug | PREFILLED_SYRINGE | INTRAVENOUS | Status: DC | PRN

## 2020-11-22 MED ORDER — COCONUT OIL OIL: 1.0000 "application " | TOPICAL_OIL | Status: DC | PRN

## 2020-11-22 MED ORDER — FENTANYL-BUPIVACAINE-NACL 0.5-0.125-0.9 MG/250ML-% EP SOLN
12.0000 mL/h | EPIDURAL | Status: DC | PRN
  Administered 2020-11-22: 12 mL/h via EPIDURAL

## 2020-11-22 MED ORDER — FENTANYL CITRATE (PF) 100 MCG/2ML IJ SOLN
100.0000 ug | Freq: Once | INTRAMUSCULAR | Status: AC
  Administered 2020-11-22: 100 ug via INTRAVENOUS
  Filled 2020-11-22: qty 2

## 2020-11-22 MED ORDER — FLEET ENEMA 7-19 GM/118ML RE ENEM: 1.0000 | ENEMA | RECTAL | Status: DC | PRN

## 2020-11-22 MED ORDER — OXYCODONE HCL 5 MG/5ML PO SOLN: 5.0000 mg | Freq: Once | ORAL | Status: DC | PRN

## 2020-11-22 MED ORDER — ONDANSETRON HCL 4 MG/2ML IJ SOLN
4.0000 mg | Freq: Four times a day (QID) | INTRAMUSCULAR | Status: DC | PRN
Start: 2020-11-22 — End: 2020-11-22

## 2020-11-22 MED ORDER — TRANEXAMIC ACID-NACL 1000-0.7 MG/100ML-% IV SOLN
INTRAVENOUS | Status: AC
  Administered 2020-11-22: 1000 mg
  Filled 2020-11-22: qty 100

## 2020-11-22 MED ORDER — ONDANSETRON HCL 4 MG/2ML IJ SOLN: 4.0000 mg | INTRAMUSCULAR | Status: DC | PRN

## 2020-11-22 MED ORDER — DIBUCAINE (PERIANAL) 1 % EX OINT: 1.0000 "application " | TOPICAL_OINTMENT | CUTANEOUS | Status: DC | PRN

## 2020-11-22 MED ORDER — FENTANYL CITRATE (PF) 100 MCG/2ML IJ SOLN
INTRAMUSCULAR | Status: AC
  Filled 2020-11-22: qty 2

## 2020-11-22 MED ORDER — AMISULPRIDE (ANTIEMETIC) 5 MG/2ML IV SOLN: 10.0000 mg | Freq: Once | INTRAVENOUS | Status: DC | PRN

## 2020-11-22 MED ORDER — FENTANYL CITRATE (PF) 100 MCG/2ML IJ SOLN: 100.0000 ug | Freq: Once | INTRAMUSCULAR | Status: DC

## 2020-11-22 MED ORDER — OXYCODONE-ACETAMINOPHEN 5-325 MG PO TABS: 2.0000 | ORAL_TABLET | ORAL | Status: DC | PRN

## 2020-11-22 MED ORDER — SODIUM CHLORIDE 0.9 % IV SOLN
500.0000 mg | Freq: Once | INTRAVENOUS | Status: AC
  Administered 2020-11-22: 500 mg via INTRAVENOUS
  Filled 2020-11-22: qty 25

## 2020-11-22 MED ORDER — DIPHENHYDRAMINE HCL 50 MG/ML IJ SOLN
12.5000 mg | INTRAMUSCULAR | Status: DC | PRN
Start: 2020-11-22 — End: 2020-11-22

## 2020-11-22 MED ORDER — ACETAMINOPHEN 325 MG PO TABS: 650.0000 mg | ORAL_TABLET | ORAL | Status: DC | PRN

## 2020-11-22 MED ORDER — PRENATAL MULTIVITAMIN CH
1.0000 | ORAL_TABLET | Freq: Every day | ORAL | Status: DC
  Administered 2020-11-23 – 2020-11-24 (×2): 1 via ORAL
  Filled 2020-11-22 (×2): qty 1

## 2020-11-22 MED ORDER — ONDANSETRON HCL 4 MG PO TABS: 4.0000 mg | ORAL_TABLET | ORAL | Status: DC | PRN

## 2020-11-22 MED ORDER — TETANUS-DIPHTH-ACELL PERTUSSIS 5-2.5-18.5 LF-MCG/0.5 IM SUSY: 0.5000 mL | PREFILLED_SYRINGE | Freq: Once | INTRAMUSCULAR | Status: DC

## 2020-11-22 MED ORDER — IBUPROFEN 600 MG PO TABS
600.0000 mg | ORAL_TABLET | Freq: Four times a day (QID) | ORAL | Status: DC
  Administered 2020-11-22 – 2020-11-24 (×8): 600 mg via ORAL
  Filled 2020-11-22 (×8): qty 1

## 2020-11-22 MED ORDER — SOD CITRATE-CITRIC ACID 500-334 MG/5ML PO SOLN
30.0000 mL | ORAL | Status: DC | PRN
Start: 2020-11-22 — End: 2020-11-22
  Administered 2020-11-22: 30 mL via ORAL
  Filled 2020-11-22: qty 15

## 2020-11-22 MED ORDER — OXYTOCIN-SODIUM CHLORIDE 30-0.9 UT/500ML-% IV SOLN: 2.5000 [IU]/h | INTRAVENOUS | Status: DC

## 2020-11-22 MED ORDER — TRANEXAMIC ACID-NACL 1000-0.7 MG/100ML-% IV SOLN: 1000.0000 mg | INTRAVENOUS | Status: DC

## 2020-11-22 MED ORDER — BENZOCAINE-MENTHOL 20-0.5 % EX AERO
1.0000 | INHALATION_SPRAY | CUTANEOUS | Status: DC | PRN
Start: 2020-11-22 — End: 2020-11-24

## 2020-11-22 MED ORDER — OXYTOCIN BOLUS FROM INFUSION
333.0000 mL | Freq: Once | INTRAVENOUS | Status: AC
  Administered 2020-11-22: 333 mL via INTRAVENOUS

## 2020-11-22 MED ORDER — POLYSACCHARIDE IRON COMPLEX 150 MG PO CAPS
150.0000 mg | ORAL_CAPSULE | Freq: Every day | ORAL | Status: DC
  Administered 2020-11-23 – 2020-11-24 (×2): 150 mg via ORAL
  Filled 2020-11-22 (×2): qty 1

## 2020-11-22 MED ORDER — FENTANYL CITRATE (PF) 100 MCG/2ML IJ SOLN
INTRAMUSCULAR | Status: DC | PRN
  Administered 2020-11-22: 25 ug via INTRATHECAL

## 2020-11-22 MED ORDER — MIDAZOLAM HCL 2 MG/2ML IJ SOLN
INTRAMUSCULAR | Status: AC
  Filled 2020-11-22: qty 2

## 2020-11-22 MED ORDER — WITCH HAZEL-GLYCERIN EX PADS: 1.0000 "application " | MEDICATED_PAD | CUTANEOUS | Status: DC | PRN

## 2020-11-22 MED ORDER — LIDOCAINE HCL (PF) 1 % IJ SOLN: 30.0000 mL | INTRAMUSCULAR | Status: DC | PRN

## 2020-11-22 MED ORDER — LACTATED RINGERS IV SOLN: INTRAVENOUS | Status: DC | PRN

## 2020-11-22 MED ORDER — FENTANYL CITRATE (PF) 100 MCG/2ML IJ SOLN
25.0000 ug | INTRAMUSCULAR | Status: DC | PRN
Start: 2020-11-22 — End: 2020-11-22
  Administered 2020-11-22: 50 ug via INTRAVENOUS
  Administered 2020-11-22: 25 ug via INTRAVENOUS

## 2020-11-22 MED ORDER — SENNOSIDES-DOCUSATE SODIUM 8.6-50 MG PO TABS
2.0000 | ORAL_TABLET | Freq: Every day | ORAL | Status: DC
  Administered 2020-11-23 – 2020-11-24 (×2): 2 via ORAL
  Filled 2020-11-22 (×2): qty 2

## 2020-11-22 MED ORDER — LACTATED RINGERS IV SOLN
500.0000 mL | INTRAVENOUS | Status: DC | PRN
Start: 2020-11-22 — End: 2020-11-22

## 2020-11-22 MED ORDER — IBUPROFEN 600 MG PO TABS: 600.0000 mg | ORAL_TABLET | Freq: Four times a day (QID) | ORAL | Status: DC | PRN

## 2020-11-22 MED ORDER — DIPHENHYDRAMINE HCL 25 MG PO CAPS: 25.0000 mg | ORAL_CAPSULE | Freq: Four times a day (QID) | ORAL | Status: DC | PRN

## 2020-11-22 MED ORDER — ZOLPIDEM TARTRATE 5 MG PO TABS: 5.0000 mg | ORAL_TABLET | Freq: Every evening | ORAL | Status: DC | PRN

## 2020-11-22 MED ORDER — OXYCODONE HCL 5 MG PO TABS: 5.0000 mg | ORAL_TABLET | Freq: Once | ORAL | Status: DC | PRN

## 2020-11-22 MED ORDER — ONDANSETRON HCL 4 MG/2ML IJ SOLN
INTRAMUSCULAR | Status: AC
  Filled 2020-11-22: qty 2

## 2020-11-22 MED ORDER — FENTANYL-BUPIVACAINE-NACL 0.5-0.125-0.9 MG/250ML-% EP SOLN
EPIDURAL | Status: AC
  Filled 2020-11-22: qty 250

## 2020-11-22 MED ORDER — ACETAMINOPHEN 325 MG PO TABS
650.0000 mg | ORAL_TABLET | ORAL | Status: DC | PRN
  Administered 2020-11-23 – 2020-11-24 (×3): 650 mg via ORAL
  Filled 2020-11-22 (×3): qty 2

## 2020-11-22 MED ORDER — OXYCODONE-ACETAMINOPHEN 5-325 MG PO TABS: 1.0000 | ORAL_TABLET | ORAL | Status: DC | PRN

## 2020-11-22 SURGICAL SUPPLY — 28 items
CLIP FILSHIE TUBAL LIGA STRL (Clip) IMPLANT
CLOTH BEACON ORANGE TIMEOUT ST (SAFETY) ×2 IMPLANT
DERMABOND ADVANCED (GAUZE/BANDAGES/DRESSINGS) ×1
DERMABOND ADVANCED .7 DNX12 (GAUZE/BANDAGES/DRESSINGS) ×1 IMPLANT
DRSG OPSITE POSTOP 3X4 (GAUZE/BANDAGES/DRESSINGS) ×2 IMPLANT
DURAPREP 26ML APPLICATOR (WOUND CARE) ×2 IMPLANT
ELECT REM PT RETURN 9FT ADLT (ELECTROSURGICAL) ×2
ELECTRODE REM PT RTRN 9FT ADLT (ELECTROSURGICAL) ×1 IMPLANT
GLOVE BIO SURGEON STRL SZ 6.5 (GLOVE) ×2 IMPLANT
GLOVE BIOGEL PI IND STRL 7.0 (GLOVE) ×3 IMPLANT
GLOVE BIOGEL PI INDICATOR 7.0 (GLOVE) ×3
GOWN STRL REUS W/TWL LRG LVL3 (GOWN DISPOSABLE) ×4 IMPLANT
LIGASURE IMPACT 36 18CM CVD LR (INSTRUMENTS) ×2 IMPLANT
NEEDLE HYPO 22GX1.5 SAFETY (NEEDLE) ×2 IMPLANT
NS IRRIG 1000ML POUR BTL (IV SOLUTION) ×2 IMPLANT
PACK ABDOMINAL MINOR (CUSTOM PROCEDURE TRAY) ×2 IMPLANT
PENCIL BUTTON HOLSTER BLD 10FT (ELECTRODE) ×2 IMPLANT
PROTECTOR NERVE ULNAR (MISCELLANEOUS) ×2 IMPLANT
SPONGE LAP 4X18 RFD (DISPOSABLE) ×2 IMPLANT
SUT MON AB 4-0 PS1 27 (SUTURE) ×2 IMPLANT
SUT PLAIN 0 NONE (SUTURE) IMPLANT
SUT VIC AB 0 CT1 27 (SUTURE) ×1
SUT VIC AB 0 CT1 27XBRD ANBCTR (SUTURE) ×1 IMPLANT
SUT VICRYL 0 UR6 27IN ABS (SUTURE) ×2 IMPLANT
SYR CONTROL 10ML LL (SYRINGE) ×2 IMPLANT
TOWEL OR 17X24 6PK STRL BLUE (TOWEL DISPOSABLE) ×4 IMPLANT
TRAY FOLEY CATH SILVER 14FR (SET/KITS/TRAYS/PACK) ×2 IMPLANT
WATER STERILE IRR 1000ML POUR (IV SOLUTION) ×2 IMPLANT

## 2020-11-22 NOTE — Transfer of Care (Signed)
Immediate Anesthesia Transfer of Care Note  Patient: Kathleen Vega  Procedure(s) Performed: POST PARTUM TUBAL LIGATION (N/A )  Patient Location: PACU  Anesthesia Type:Epidural  Level of Consciousness: awake, alert  and oriented  Airway & Oxygen Therapy: Patient Spontanous Breathing  Post-op Assessment: Report given to RN and Post -op Vital signs reviewed and stable  Post vital signs: Reviewed and stable  Last Vitals:  Vitals Value Taken Time  BP    Temp    Pulse    Resp    SpO2      Last Pain:  Vitals:   11/22/20 1109  TempSrc:   PainSc: 0-No pain         Complications: No complications documented.

## 2020-11-22 NOTE — Anesthesia Preprocedure Evaluation (Signed)
Anesthesia Evaluation  Patient identified by MRN, date of birth, ID band Patient awake    Reviewed: Allergy & Precautions, H&P , NPO status , Patient's Chart, lab work & pertinent test results  History of Anesthesia Complications Negative for: history of anesthetic complications  Airway Mallampati: II  TM Distance: >3 FB Neck ROM: full    Dental no notable dental hx.    Pulmonary neg pulmonary ROS,    Pulmonary exam normal        Cardiovascular negative cardio ROS Normal cardiovascular exam Rhythm:regular Rate:Normal     Neuro/Psych  Headaches, negative psych ROS   GI/Hepatic negative GI ROS, Neg liver ROS,   Endo/Other  negative endocrine ROS  Renal/GU negative Renal ROS  negative genitourinary   Musculoskeletal   Abdominal   Peds  Hematology  (+) Blood dyscrasia, anemia ,   Anesthesia Other Findings   Reproductive/Obstetrics                             Anesthesia Physical  Anesthesia Plan  ASA: II  Anesthesia Plan: Epidural   Post-op Pain Management:    Induction:   PONV Risk Score and Plan: 2  Airway Management Planned: Natural Airway and Simple Face Mask  Additional Equipment: None  Intra-op Plan:   Post-operative Plan:   Informed Consent: I have reviewed the patients History and Physical, chart, labs and discussed the procedure including the risks, benefits and alternatives for the proposed anesthesia with the patient or authorized representative who has indicated his/her understanding and acceptance.       Plan Discussed with: CRNA, Anesthesiologist and Surgeon  Anesthesia Plan Comments: (Plan to use existing epidural and dose for surgical anesthesia. Tanna Furry, MD  )        Anesthesia Quick Evaluation

## 2020-11-22 NOTE — Anesthesia Preprocedure Evaluation (Signed)
Anesthesia Evaluation  Patient identified by MRN, date of birth, ID band Patient awake    Reviewed: Allergy & Precautions, H&P , NPO status , Patient's Chart, lab work & pertinent test results  History of Anesthesia Complications Negative for: history of anesthetic complications  Airway Mallampati: II  TM Distance: >3 FB Neck ROM: full    Dental no notable dental hx.    Pulmonary neg pulmonary ROS,    Pulmonary exam normal        Cardiovascular negative cardio ROS Normal cardiovascular exam Rhythm:regular Rate:Normal     Neuro/Psych negative neurological ROS  negative psych ROS   GI/Hepatic negative GI ROS, Neg liver ROS,   Endo/Other  negative endocrine ROS  Renal/GU negative Renal ROS  negative genitourinary   Musculoskeletal   Abdominal   Peds  Hematology  (+) Blood dyscrasia, anemia ,   Anesthesia Other Findings   Reproductive/Obstetrics (+) Pregnancy                             Anesthesia Physical Anesthesia Plan  ASA: II  Anesthesia Plan: Epidural   Post-op Pain Management:    Induction:   PONV Risk Score and Plan:   Airway Management Planned:   Additional Equipment:   Intra-op Plan:   Post-operative Plan:   Informed Consent: I have reviewed the patients History and Physical, chart, labs and discussed the procedure including the risks, benefits and alternatives for the proposed anesthesia with the patient or authorized representative who has indicated his/her understanding and acceptance.       Plan Discussed with:   Anesthesia Plan Comments:         Anesthesia Quick Evaluation  

## 2020-11-22 NOTE — Anesthesia Procedure Notes (Signed)
Epidural Patient location during procedure: OB Start time: 11/22/2020 6:02 AM End time: 11/22/2020 6:18 AM  Staffing Anesthesiologist: Lucretia Kern, MD Performed: anesthesiologist   Preanesthetic Checklist Completed: patient identified, IV checked, risks and benefits discussed, monitors and equipment checked, pre-op evaluation and timeout performed  Epidural Patient position: sitting Prep: DuraPrep Patient monitoring: heart rate, continuous pulse ox and blood pressure Approach: midline Location: L3-L4 Injection technique: LOR air  Needle:  Needle type: Tuohy (+ 12.7 cm 25g Pencan spinal needle)  Needle gauge: 17 G Needle length: 9 cm Needle insertion depth: 7 cm Catheter type: closed end flexible Catheter size: 19 Gauge Catheter at skin depth: 12 cm Test dose: negative  Assessment Events: blood not aspirated, injection not painful, no injection resistance, no paresthesia and negative IV test  Additional Notes The patient was prepped and draped in the usual sterile fashion. A combined spinal-epidural was performed using a 9 cm 17g Tuohy needle and loss of resistance technique. After encountering LOR, a 12.7 cm 25g Pencan spinal needle was introduced via the Tuohy and clear CSF was aspirated prior to injection of fentanyl/local anesthetic. The spinal needle was removed and a 19 g flexible epidural catheter placed prior to Tuohy removal. Patient tolerated the procedure well without complications.Reason for block:procedure for pain

## 2020-11-22 NOTE — Anesthesia Postprocedure Evaluation (Signed)
Anesthesia Post Note  Patient: Kathleen Vega  Procedure(s) Performed: POST PARTUM TUBAL LIGATION (N/A )     Patient location during evaluation: PACU Anesthesia Type: Epidural Level of consciousness: awake Pain management: pain level controlled Vital Signs Assessment: post-procedure vital signs reviewed and stable Respiratory status: spontaneous breathing and respiratory function stable Cardiovascular status: stable Postop Assessment: no apparent nausea or vomiting Anesthetic complications: no   No complications documented.  Last Vitals:  Vitals:   11/22/20 1600 11/22/20 1609  BP: 117/66 118/77  Pulse: 63 (!) 59  Resp: (!) 23 20  Temp:  36.7 C  SpO2: 98% 100%    Last Pain:  Vitals:   11/22/20 1609  TempSrc: Oral  PainSc: 4    Pain Goal:                   Mellody Dance

## 2020-11-22 NOTE — MAU Note (Signed)
Covid swab obtained without difficulty. Patient transported to Labor and delivery accompanied by Wynelle Bourgeois, CNM.

## 2020-11-22 NOTE — H&P (Addendum)
Zorina Calligan is a 30 y.o. female  G3P2 at 40.4 weeks Reports painful contractions Denies vb or lof. OB History    Gravida  3   Para  2   Term  2   Preterm      AB      Living  2     SAB      IAB      Ectopic      Multiple  0   Live Births  2          Past Medical History:  Diagnosis Date  . Anemia   . Headache   . History of postpartum hemorrhage   . Hx of varicella    Past Surgical History:  Procedure Laterality Date  . NO PAST SURGERIES     Family History: family history includes Hypertension in her father; Stroke in her father. Social History:  reports that she has never smoked. She has never used smokeless tobacco. She reports that she does not drink alcohol and does not use drugs.     Maternal Diabetes: No Genetic Screening: Normal Maternal Ultrasounds/Referrals: Normal Fetal Ultrasounds or other Referrals:  None Maternal Substance Abuse:  No Significant Maternal Medications:  None Significant Maternal Lab Results:  Group B Strep negative Other Comments:  None  Review of Systems  Constitutional: Negative.   HENT: Negative.   Eyes: Negative.   Respiratory: Negative.   Cardiovascular: Negative.   Gastrointestinal: Negative.   Endocrine: Negative.   Genitourinary: Negative.   Musculoskeletal: Negative.   Skin: Negative.   Allergic/Immunologic: Negative.   Neurological: Negative.   Hematological: Negative.   Psychiatric/Behavioral: Negative.    History Dilation: Lip/rim Effacement (%): 90 Station: -2 Exam by:: Mattel, RN Blood pressure 116/70, pulse 66, temperature 98.4 F (36.9 C), temperature source Oral, resp. rate 19, SpO2 100 %, unknown if currently breastfeeding. Maternal Exam:  Introitus: Normal vulva.   Physical Exam Vitals and nursing note reviewed. Exam conducted with a chaperone present.  Constitutional:      Appearance: Normal appearance. She is normal weight.  HENT:     Head: Normocephalic and atraumatic.      Nose: Nose normal.     Mouth/Throat:     Mouth: Mucous membranes are moist.     Pharynx: Oropharynx is clear.  Eyes:     Extraocular Movements: Extraocular movements intact.     Conjunctiva/sclera: Conjunctivae normal.     Pupils: Pupils are equal, round, and reactive to light.  Cardiovascular:     Rate and Rhythm: Normal rate and regular rhythm.     Pulses: Normal pulses.     Heart sounds: Normal heart sounds.  Pulmonary:     Effort: Pulmonary effort is normal.     Breath sounds: Normal breath sounds.  Abdominal:     Palpations: Abdomen is soft.  Genitourinary:    General: Normal vulva.  Musculoskeletal:        General: Normal range of motion.     Cervical back: Normal range of motion and neck supple.  Skin:    General: Skin is warm.  Neurological:     General: No focal deficit present.     Mental Status: She is alert. Mental status is at baseline.  Psychiatric:        Mood and Affect: Mood normal.        Behavior: Behavior normal.        Thought Content: Thought content normal.        Judgment:  Judgment normal.    Cat I tracing  Prenatal labs: ABO, Rh: --/--/PENDING (03/04 9150) Antibody: PENDING (03/04 0435) Rubella: Immune (08/05 0000) RPR: Nonreactive (08/05 0000)  HBsAg: Negative (08/05 0000)  HIV: Non-reactive (08/05 0000)  GBS:   neg  Assessment/Plan: Admit to LDR Routine orders Pt strongly desires epidural if time permits and to delay AROM in hopes Prep for standby for imminent delivery    Victorino Dike B Crumpler 11/22/2020, 5:30 AM

## 2020-11-22 NOTE — Progress Notes (Signed)
J Crumpler CNM notified of pt's admission, sve. Will admit to BS.

## 2020-11-22 NOTE — Op Note (Signed)
OPERATIVE NOTE  Kathleen Vega  DOB:    [birth year]  MRN:    [id number]  CSN:    [id number]  Date of Surgery:  11/22/2020   Preoperative Diagnosis: Desired Sterilization  Postoperative Diagnosis: Same as above  Procedure:  Post partum tubal ligation via bilateral salpingectomy  Surgeon: Delila Spence. Mora Appl, M.D.  Assistant: None  Anesthetic: Local, Epidural    Fluids:500 mL UOP:150 mL  Catheter: foley EBL:  5 mL Complications: None  Indication:  [name] is a 30 y.o. year old female,who desires permanent sterilization. Risks, benefits, alternatives and indication was explained to patient prior to the procedure.   Findings:  After adequate level of epidural was determined for surgery the patient was prepped and draped in the usual sterile fashion.  After infiltration with 0.25% Marcaine in the area a small 1cm subumbilical incision was performed ausing a 15 blade scalpel  The fascia was entered sharply using Mayo scissors and the peritoneum identified and easily entered. The fundus of the uterus was easily located with the surgeon's fingers through the incision and the left fallopian tube was swept into view so that the midportion of the tube could be grasped with Babcock clamps and delivered. As the loop was held up, the fallopian tube was traced down to the fimbriated end to ensure that this was the fallopian tube. Using the Ligasure Impact the mesosalpinx was clamped, cauterized and transected from the fimbriated end to the insertion into the uterus.  The tubal segment was handed off to be sent to Pathology. The process was repeated on the opposite side. Hemostasis was good. An umbilical hernia was noted and so the fascia was reapproximated with 0 vicryl in running fashion. The skin was reapproximated with subcuticular stitch of 4-0 monocryl. The patient's incision was cleaned and dermabond was placed.   The patient tolerated the procedure well and was sent to the PACU in good  condition. All instrument, sponge and needle counts were correct x 2  Rainier Feuerborn STACIA

## 2020-11-23 ENCOUNTER — Inpatient Hospital Stay (HOSPITAL_COMMUNITY): Admission: AD | Admit: 2020-11-23 | Payer: 59 | Source: Home / Self Care | Admitting: Obstetrics & Gynecology

## 2020-11-23 ENCOUNTER — Inpatient Hospital Stay (HOSPITAL_COMMUNITY): Payer: 59

## 2020-11-23 ENCOUNTER — Encounter (HOSPITAL_COMMUNITY): Payer: Self-pay | Admitting: Obstetrics and Gynecology

## 2020-11-23 LAB — CBC
HCT: 31.1 % — ABNORMAL LOW (ref 36.0–46.0)
Hemoglobin: 9.8 g/dL — ABNORMAL LOW (ref 12.0–15.0)
MCH: 23.3 pg — ABNORMAL LOW (ref 26.0–34.0)
MCHC: 31.5 g/dL (ref 30.0–36.0)
MCV: 73.9 fL — ABNORMAL LOW (ref 80.0–100.0)
Platelets: 118 10*3/uL — ABNORMAL LOW (ref 150–400)
RBC: 4.21 MIL/uL (ref 3.87–5.11)
RDW: 15.7 % — ABNORMAL HIGH (ref 11.5–15.5)
WBC: 12.1 10*3/uL — ABNORMAL HIGH (ref 4.0–10.5)
nRBC: 0 % (ref 0.0–0.2)

## 2020-11-23 MED ORDER — SODIUM CHLORIDE 0.9 % IV SOLN
2.0000 g | Freq: Once | INTRAVENOUS | Status: AC
  Administered 2020-11-23: 2 g via INTRAVENOUS
  Filled 2020-11-23: qty 2

## 2020-11-23 NOTE — Progress Notes (Signed)
Pt's temp was retaken with different thermometer per NP's orders.  Temp was 100.1.  Pt states she is feeling better and does not at the moment, feel cold.  Will continue to monitor and update NP for status changes.

## 2020-11-23 NOTE — Anesthesia Postprocedure Evaluation (Signed)
Anesthesia Post Note  Patient: Kathleen Vega  Procedure(s) Performed: AN AD HOC LABOR EPIDURAL     Patient location during evaluation: Mother Baby Anesthesia Type: Epidural Level of consciousness: awake and alert, oriented and patient cooperative Pain management: pain level controlled Vital Signs Assessment: post-procedure vital signs reviewed and stable Respiratory status: spontaneous breathing Cardiovascular status: stable Postop Assessment: no headache, epidural receding, patient able to bend at knees and no signs of nausea or vomiting Anesthetic complications: no Comments: Pt. States she is walking. Pain score 2.    No complications documented.  Last Vitals:  Vitals:   11/23/20 0300 11/23/20 0546  BP:  (!) 107/53  Pulse:    Resp:  16  Temp: 37.6 C 37.2 C  SpO2: 100% 100%    Last Pain:  Vitals:   11/23/20 0546  TempSrc: Oral  PainSc:    Pain Goal:                   [facility]

## 2020-11-23 NOTE — Progress Notes (Signed)
Pt up to bathroom.  Very slow and steady gait.  Left leg still somewhat heavy, but able to bear weight.  Pt has been shivering and complaining of feeling cold.  Temp was taken at 102.9  Tylenol was given.  Will retake temp and call physician.

## 2020-11-23 NOTE — Progress Notes (Signed)
PPD# 1 SVD w/ intact perineum and PP BTL Information for the patient's newborn:  Kathleen Vega, Kathleen Vega [[id number]]  female     S:   Reports feeling good Tolerating PO fluid and solids No nausea or vomiting Bleeding is light Pain controlled with PO meds Up ad lib / ambulatory / voiding w/o difficulty Feeding: Bottle    O:   VS: BP (!) 107/53 (BP Location: Left Arm)   Pulse 68   Temp 98.7 F (37.1 C)   Resp 16   Ht 5\' 4"  (1.626 m)   Wt 98.9 kg   SpO2 100%   Breastfeeding Unknown   BMI 37.42 kg/m   LABS:  Recent Labs    11/22/20 1128 11/23/20 0421  WBC 11.7* 12.1*  HGB 9.5* 9.8*  PLT 126* 118*   Blood type: --/--/O POS (03/04 0435) Rubella: Immune (08/05 0000)                      I&O: Intake/Output      03/04 0701 03/05 0700 03/05 0701 03/06 0700   I.V. (mL/kg) 500 (5.1)    Total Intake(mL/kg) 500 (5.1)    Urine (mL/kg/hr) 750 (0.3)    Blood 1387    Total Output 2137    Net -1637         Urine Occurrence 1 x      Physical Exam: Alert and oriented X3 Lungs: Clear and unlabored Heart: regular rate and rhythm / no mumurs Abdomen: soft, non-tender, non-distended, periumbilical surgical site dressing dry and intact  Fundus: firm, non-tender Perineum: non-edematous Lochia: appropriate Extremities: trace edema, no calf pain or tenderness    A:  PPD # 1  Normal exam PP BTL Acute blood loss anemia    -S/P iron infusion    -asymptomatic  Fever of unknown origin    -S/P Rocephin 2G IV x1 dose    -urine culture sent    -repeat CBC     P:  Continue routine post partum orders Anticipate D/C on 11/24/20   01/24/21, MSN, CNM 11/23/2020, 1:38 PM

## 2020-11-24 ENCOUNTER — Inpatient Hospital Stay (HOSPITAL_COMMUNITY): Payer: 59

## 2020-11-24 DIAGNOSIS — Z9851 Tubal ligation status: Secondary | ICD-10-CM

## 2020-11-24 LAB — CBC
HCT: 31 % — ABNORMAL LOW (ref 36.0–46.0)
Hemoglobin: 10.1 g/dL — ABNORMAL LOW (ref 12.0–15.0)
MCH: 23.7 pg — ABNORMAL LOW (ref 26.0–34.0)
MCHC: 32.6 g/dL (ref 30.0–36.0)
MCV: 72.8 fL — ABNORMAL LOW (ref 80.0–100.0)
Platelets: 164 10*3/uL (ref 150–400)
RBC: 4.26 MIL/uL (ref 3.87–5.11)
RDW: 16.2 % — ABNORMAL HIGH (ref 11.5–15.5)
WBC: 15.4 10*3/uL — ABNORMAL HIGH (ref 4.0–10.5)
nRBC: 0 % (ref 0.0–0.2)

## 2020-11-24 MED ORDER — POLYSACCHARIDE IRON COMPLEX 150 MG PO CAPS: 150.0000 mg | ORAL_CAPSULE | Freq: Every day | ORAL | 1 refills | Status: AC

## 2020-11-24 MED ORDER — ACETAMINOPHEN 325 MG PO TABS: 650.0000 mg | ORAL_TABLET | ORAL | 0 refills | Status: AC | PRN

## 2020-11-24 MED ORDER — IBUPROFEN 600 MG PO TABS: 600.0000 mg | ORAL_TABLET | Freq: Four times a day (QID) | ORAL | 0 refills | Status: AC

## 2020-11-24 NOTE — Discharge Summary (Signed)
SVD OB Discharge Summary     Patient Name: Kathleen Vega DOB: 03-Aug-1991 MRN: [id number]  Date of admission: 11/22/2020 Delivering MD: Dale [place]  Date of delivery: 11/22/2020 Type of delivery: SVD  Newborn Data: Sex: Baby female Live born female  Birth Weight: 9 lb 1.7 oz (4131 g) APGAR: 9, 9  Newborn Delivery   Birth date/time: 11/22/2020 10:45:00 Delivery type: Vaginal, Spontaneous      Feeding: breast Infant being discharge to home with mother in stable condition.   Admitting diagnosis: Indication for care in labor or delivery [O75.9] Intrauterine pregnancy: [redacted]w[redacted]d     Secondary diagnosis:  Active Problems:   Indication for care in labor or delivery   Acute blood loss anemia   PPH (postpartum hemorrhage)   Normal postpartum course   S/P tubal ligation                                Complications: None                                                              Intrapartum Procedures: spontaneous vaginal delivery Postpartum Procedures: transfusion venofer and P.P. tubal ligation Complications-Operative and Postpartum: hemorrhage Augmentation: AROM   History of Present Illness: Ms. Kathleen Vega is a 30 y.o. female, G3P3003, who presents at [redacted]w[redacted]d weeks gestation. The patient has been followed at  [facility]  Her pregnancy has been complicated by:  Patient Active Problem List   Diagnosis Date Noted  . S/P tubal ligation 11/24/2020  . Indication for care in labor or delivery 11/22/2020  . Acute blood loss anemia 11/22/2020  . PPH (postpartum hemorrhage) 11/22/2020  . Normal postpartum course 11/22/2020  . Normal labor and delivery 03/23/2018  . Vaginal delivery 04/19/2016  . Post-dates pregnancy 04/18/2016  . Anemia affecting pregnancy in third trimester 04/18/2016    Hospital course:  Onset of Labor With Vaginal Delivery      30 y.o. yo G3P3003 at [redacted]w[redacted]d was admitted in Active Labor on 11/22/2020. Patient had an  uncomplicated labor course as follows:  Membrane Rupture Time/Date: 7:22 AM ,11/22/2020   Delivery Method:Vaginal, Spontaneous  Episiotomy: None  Lacerations:  None  Patient had an uncomplicated postpartum course.  She is ambulating, tolerating a regular diet, passing flatus, and urinating well. Patient is discharged home in stable condition on 11/24/20.  Newborn Data: Birth date:11/22/2020  Birth time:10:45 AM  Gender:Female  Living status:Living  Apgars:9 ,9  Weight:4131 g  Postpartum Day # 2 : S/P NSVD due on 3/4, Kathleen Vega, 29 y.o., @ [redacted]w[redacted]d,  [id number], who was admitted for on 3/4 for spontaneous labor at 8cm, pt progressed to 8cm, reviewed R/B/A of AROM, pt stable, AROM, light meconium, tolerated well. Pt the progressed comfortable with epidural.  Had SVD on 3/4 over intact perineum, with PPH EBL 1030, given TXA, pitocin, and cytotex then lost another and was given IM methergine followed by PO series, hgb drop from 9.9-9.5-9.8, s/p venofer on 3/4, 3/5 PO Iron, will continue at home, asymptomatic, had PP BTL with Dr Mora Appl on 3/4, tolerated well ebl was , Patient up ad lib, denies syncope or dizziness. Reports consuming regular diet without issues and denies N/V. On  3/5 pt had x1 elevated temp of 102.9 documented, RN rechecked and was 100.1, pt has H/O UTI and had +nitrates at [facility] prior to deliver, pt declined tx at that time due to desires to know if this was a real UTI or not, I suspected possible peylo, but pt declined any urinary s/sx, denies back pain, but I tx with x1 dose 2gm rocephin IV, and sent a urine culture. Consulted with Dr Connye Burkitt, no further tx is indicated at this time, pt has been afebril for 24 hours and stable to be discharged, will get TOC UC at [facility] on next visit , pt will make appointment with urinary s/sx presents. Patient reports 0 bowel movement + passing flatus.  Denies issues with urination and reports bleeding is "lighter."  Patient is breastfeeding and reports  going well.  Desires PP tubal done 3/4 for postpartum contraception.  Pain is being appropriately managed with use of po meds.   Physical exam  Vitals:   11/23/20 1157 11/23/20 1401 11/23/20 2200 11/24/20 0610  BP:  (!) 99/58 108/64 107/66  Pulse:  72 94 (!) 57  Resp:  17 18 18   Temp: 98.7 F (37.1 C) 97.7 F (36.5 C) 97.9 F (36.6 C) 98 F (36.7 C)  TempSrc:  Oral Oral Oral  SpO2:  99% 97% 99%  Weight:      Height:       General: alert, cooperative and no distress Lochia: appropriate Uterine Fundus: firm Incision: Healing well with no significant drainage, No significant erythema, Dressing is clean, dry, and intact, honeycomb dressing CDI Perineum: Intact DVT Evaluation: No evidence of DVT seen on physical exam. Negative Homan's sign. No cords or calf tenderness. No significant calf/ankle edema.  Labs: Lab Results  Component Value Date   WBC 15.4 (H) 11/24/2020   HGB 10.1 (L) 11/24/2020   HCT 31.0 (L) 11/24/2020   MCV 72.8 (L) 11/24/2020   PLT 164 11/24/2020   No flowsheet data found.  Date of discharge: 11/24/2020 Discharge Diagnoses: Term Pregnancy-delivered Discharge instruction: per After Visit Summary and "Baby and Me Booklet".  After visit meds:   Activity:           unrestricted and pelvic rest Advance as tolerated. Pelvic rest for 6 weeks.  Diet:                routine Medications: PNV, Ibuprofen, Colace, Iron and tylenol Postpartum contraception: Tubal Ligation Condition:  Pt discharge to home with baby in stable Anemia: PO Iron Suspected UTI: F/U for TOC urine culture with [facility]  Meds: Allergies as of 11/24/2020   No Known Allergies     Medication List    TAKE these medications   acetaminophen 325 MG tablet Commonly known as: Tylenol Take 2 tablets (650 mg total) by mouth every 4 (four) hours as needed (for pain scale < 4).   ferrous sulfate 325 (65 FE) MG tablet Take 1 tablet (325 mg total) by mouth daily.   ibuprofen 600 MG tablet Commonly  known as: ADVIL Take 1 tablet (600 mg total) by mouth every 6 (six) hours.   iron polysaccharides 150 MG capsule Commonly known as: NIFEREX Take 1 capsule (150 mg total) by mouth daily. Start taking on: November 25, 2020   multivitamin-prenatal 27-0.8 MG Tabs tablet Take 1 tablet by mouth daily at 12 noon.            Discharge Care Instructions  (From admission, onward)         Start  Ordered   11/24/20 0000  Discharge wound care:       Comments: Take dressing off on day 5-7 postpartum.  Report increased drainage, redness or warmth. Clean with water, let soap trickle down body. Can leave steri strips on until they fall off or take them off gently at day 10. Keep open to air, clean and dry.   11/24/20 1045          Discharge Follow Up:   Follow-up Information    [facility]. Schedule an appointment as soon as possible for a visit in 6 week(s).   Specialty: Obstetrics and Gynecology Why: With uric culture TOC Contact information: [address] [place] [address] [phone]               [place], NP-C, CNM 11/24/2020, 10:45 AM  [place]ale [place], FNP

## 2020-11-25 LAB — CULTURE, OB URINE: Culture: >=100000 — AB

## 2020-11-26 LAB — SURGICAL PATHOLOGY
